# Patient Record
Sex: Male | Born: 1935 | Race: White | Hispanic: No | Marital: Married | Smoking: Former smoker
Health system: Southern US, Community
[De-identification: ages and names within clinical notes are randomized; demographics above are authoritative.]

## PROBLEM LIST (undated history)

## (undated) DIAGNOSIS — E039 Hypothyroidism, unspecified: Secondary | ICD-10-CM

## (undated) DIAGNOSIS — K219 Gastro-esophageal reflux disease without esophagitis: Secondary | ICD-10-CM

## (undated) DIAGNOSIS — R7301 Impaired fasting glucose: Secondary | ICD-10-CM

## (undated) DIAGNOSIS — C080 Malignant neoplasm of submandibular gland: Secondary | ICD-10-CM

## (undated) DIAGNOSIS — I10 Essential (primary) hypertension: Secondary | ICD-10-CM

## (undated) DIAGNOSIS — E079 Disorder of thyroid, unspecified: Secondary | ICD-10-CM

## (undated) DIAGNOSIS — I739 Peripheral vascular disease, unspecified: Secondary | ICD-10-CM

## (undated) DIAGNOSIS — G459 Transient cerebral ischemic attack, unspecified: Secondary | ICD-10-CM

## (undated) DIAGNOSIS — M7022 Olecranon bursitis, left elbow: Secondary | ICD-10-CM

## (undated) DIAGNOSIS — C801 Malignant (primary) neoplasm, unspecified: Secondary | ICD-10-CM

## (undated) DIAGNOSIS — G4700 Insomnia, unspecified: Secondary | ICD-10-CM

## (undated) DIAGNOSIS — R9431 Abnormal electrocardiogram [ECG] [EKG]: Secondary | ICD-10-CM

## (undated) DIAGNOSIS — I2510 Atherosclerotic heart disease of native coronary artery without angina pectoris: Secondary | ICD-10-CM

## (undated) DIAGNOSIS — N521 Erectile dysfunction due to diseases classified elsewhere: Secondary | ICD-10-CM

## (undated) HISTORY — DX: Gastro-esophageal reflux disease without esophagitis: K21.9

## (undated) HISTORY — DX: Transient cerebral ischemic attack, unspecified: G45.9

## (undated) HISTORY — DX: Atherosclerotic heart disease of native coronary artery without angina pectoris: I25.10

## (undated) HISTORY — DX: Olecranon bursitis, left elbow: M70.22

## (undated) HISTORY — DX: Peripheral vascular disease, unspecified: I73.9

## (undated) HISTORY — DX: Essential (primary) hypertension: I10

## (undated) HISTORY — DX: Abnormal electrocardiogram (ECG) (EKG): R94.31

## (undated) HISTORY — DX: Impaired fasting glucose: R73.01

## (undated) HISTORY — DX: Erectile dysfunction due to diseases classified elsewhere: N52.1

## (undated) HISTORY — DX: Insomnia, unspecified: G47.00

## (undated) HISTORY — DX: Malignant neoplasm of submandibular gland: C08.0

## (undated) HISTORY — DX: Hypothyroidism, unspecified: E03.9

---
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
## 2009-08-02 HISTORY — PX: CORONARY ARTERY BYPASS GRAFT: SHX141

## 2014-08-02 HISTORY — PX: SALIVARY GLAND SURGERY: SHX768

## 2017-10-31 HISTORY — PX: ELBOW SURGERY: SHX618

## 2018-12-19 ENCOUNTER — Encounter: Payer: Self-pay | Admitting: Cardiology

## 2019-01-07 NOTE — Progress Notes (Signed)
Virtual Visit via Video Note   This visit type was conducted due to national recommendations for restrictions regarding the COVID-19 Pandemic (e.g. social distancing) in an effort to limit this patient's exposure and mitigate transmission in our community.  Due to his co-morbid illnesses, this patient is at least at moderate risk for complications without adequate follow up.  This format is felt to be most appropriate for this patient at this time.  All issues noted in this document were discussed and addressed.  A limited physical exam was performed with this format.  Please refer to the patient's chart for his consent to telehealth for [facility].   Date:  01/08/2019   ID:  Keith Watson., DOB 03/26/1934, MRN [id number]  Patient Location: Home Provider Location: Office  PCP:  Nicoletta Dress, MD  Cardiologist:  Shirlee More, MD  Electrophysiologist:  None A   Evaluation Performed:  Follow-Up Visit  Chief Complaint:  I had a stroke  History of Present Illness:    Keith Watson. is a 85 y.o. male with CAD CABG and a recent stroke.  I have not seen Faith for over 3 years and there are no records available in epic.  He has a history of CAD bypass surgery in 2011 has had a very fortunate course with no angina dyspnea ACS palpitation or syncope.  He is on lipid-lowering and antihypertensive therapy.  He had a recent stroke resulting in gait dysfunction left lower extremity weakness and wanted to reestablish care with me but also ask what can be done to mitigate the effects allow him to regain his function.  At his request I will refer him to physical therapy.  He takes single antiplatelet agent will add clopidogrel to his current antihypertensives and combined lipid-lowering with this statin and Zetia and also do a 14-day monitor to screen him for atrial fibrillation.  He has had no bleeding palpitation or recurrent neurologic deficit.  He had duplex of the carotid  arteries performed at [facility] 12/21/2018 impression was a large amount of left-sided atherosclerotic plaque with 50 to 69% luminal moderate narrowing of the left internal carotid artery and a moderate amount of right-sided atherosclerotic plaque without stenosis.  He also had an MRI of the brain which showed probable small focus of acute infarction in the right frontal lobe white matter and atrophy and chronic ischemic changes the area in the right frontal lobe had increased signal on diffusion likely representing acute infarction  The patient does not have symptoms concerning for COVID-19 infection (fever, chills, cough, or new shortness of breath).    Past Medical History:  Diagnosis Date  . CAD (coronary artery disease) 01/08/2019  . Cancer ([place]) 01/08/2019  . Hyperlipidemia 01/08/2019  . Hypertension 01/08/2019  . Thyroid disease 01/08/2019   Past Surgical History:  Procedure Laterality Date  . CORONARY ARTERY BYPASS GRAFT  2011     Current Meds  Medication Sig  . aspirin EC 81 MG tablet Take 81 mg by mouth daily.  Marland Kitchen atenolol (TENORMIN) 100 MG tablet Take 100 mg by mouth daily.  Marland Kitchen atorvastatin (LIPITOR) 80 MG tablet Take 80 mg by mouth at bedtime.  Marland Kitchen ezetimibe (ZETIA) 10 MG tablet Take 10 mg by mouth daily.  Marland Kitchen levothyroxine (SYNTHROID) 150 MCG tablet Take 150 mcg by mouth daily before breakfast.  . lisinopril (ZESTRIL) 10 MG tablet Take 10 mg by mouth at bedtime.  Marland Kitchen omeprazole (PRILOSEC) 20 MG capsule TAKE 1 CAPSULE BY  MOUTH ONCE DAILY AS NEEDED  . traZODone (DESYREL) 100 MG tablet Take 100 mg by mouth at bedtime.     Allergies:   Patient has no known allergies.   Social History   Tobacco Use  . Smoking status: Former Smoker    Packs/day: 1.00    Years: 10.00    Pack years: 10.00    Types: Cigarettes  . Smokeless tobacco: Never Used  . Tobacco comment: quit 45 years  Substance Use Topics  . Alcohol use: Yes    Alcohol/week: 1.0 - 2.0 standard drinks    Types: 1 - 2  Standard drinks or equivalent per week    Comment: 1-2 cocktails daily  . Drug use: Never     Family Hx: The patient's family history includes Heart attack in his mother.  ROS:   Please see the history of present illness.    Review of Systems  Constitution: Negative.  HENT: Negative.   Eyes: Negative.   Cardiovascular: Negative.   Respiratory: Negative.   Endocrine: Negative.   Hematologic/Lymphatic: Negative.   Skin: Negative.   Musculoskeletal: Negative.   Gastrointestinal: Negative.   Genitourinary: Negative.   Neurological: Positive for focal weakness (LLE).  Psychiatric/Behavioral: Negative.   Allergic/Immunologic: Negative.    All other systems reviewed and are negative.   Prior CV studies:   The following studies were reviewed today:    Labs/Other Tests and Data Reviewed:    EKG:  No ECG reviewed.  Recent Labs: No results found for requested labs within last 8760 hours.   Recent Lipid Panel No results found for: CHOL, TRIG, HDL, CHOLHDL, LDLCALC, LDLDIRECT  Wt Readings from Last 3 Encounters:  01/08/19 180 lb (81.6 kg)     Objective:    Vital Signs:  BP 130/80   Ht 5' 10.5" (1.791 m)   Wt 180 lb (81.6 kg)   BMI 25.46 kg/m    VITAL SIGNS:  reviewed GEN:  no acute distress EYES:  sclerae anicteric, EOMI - Extraocular Movements Intact RESPIRATORY:  normal respiratory effort, symmetric expansion CARDIOVASCULAR:  no peripheral edema SKIN:  no rash, lesions or ulcers. MUSCULOSKELETAL:  no obvious deformities. NEURO:  alert and oriented x 3, no obvious focal deficit PSYCH:  normal affect  ASSESSMENT & PLAN:    1.   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  His carotid stenosis is contralateral to the stroke.  He will need a follow-up cerebrovascular duplex in 1 year Hypertension stable continue current treatment beta-blocker ACE inhibitor  Hyperlipidemia stable continue statin CAD stable after CABG and with current anti-ischemic medications at this time I would not pursue an ischemia evaluation continue medical treatment and add clopidogrel to his aspirin therapy   COVID-19 Education: The signs and symptoms of COVID-19 were discussed with the patient and how to seek care for testing (follow up with PCP or arrange E-visit).  The importance of social distancing was discussed today.  Time:   Today, I have spent 30 minutes with the patient with telehealth technology discussing the above problems.     Medication Adjustments/Labs and Tests Ordered: Current medicines are reviewed at length with the patient today.  Concerns regarding medicines are outlined above.   Tests Ordered: No orders of the defined types were placed in this encounter.   Medication Changes: No orders of the defined types were placed in this encounter.   Disposition:  Follow up in  6 week(s)  Signed, Shirlee More, MD  01/08/2019 9:05 AM    [place]

## 2019-01-08 ENCOUNTER — Telehealth (INDEPENDENT_AMBULATORY_CARE_PROVIDER_SITE_OTHER): Payer: Medicare Other | Admitting: Cardiology

## 2019-01-08 ENCOUNTER — Encounter: Payer: Self-pay | Admitting: Cardiology

## 2019-01-08 ENCOUNTER — Other Ambulatory Visit: Payer: Self-pay

## 2019-01-08 ENCOUNTER — Encounter: Payer: Self-pay | Admitting: *Deleted

## 2019-01-08 VITALS — BP 130/80 | Ht 70.5 in | Wt 180.0 lb

## 2019-01-08 DIAGNOSIS — R002 Palpitations: Secondary | ICD-10-CM

## 2019-01-08 DIAGNOSIS — I25810 Atherosclerosis of coronary artery bypass graft(s) without angina pectoris: Secondary | ICD-10-CM

## 2019-01-08 DIAGNOSIS — I639 Cerebral infarction, unspecified: Secondary | ICD-10-CM | POA: Insufficient documentation

## 2019-01-08 DIAGNOSIS — I10 Essential (primary) hypertension: Secondary | ICD-10-CM | POA: Diagnosis not present

## 2019-01-08 DIAGNOSIS — E785 Hyperlipidemia, unspecified: Secondary | ICD-10-CM | POA: Insufficient documentation

## 2019-01-08 DIAGNOSIS — I2510 Atherosclerotic heart disease of native coronary artery without angina pectoris: Secondary | ICD-10-CM | POA: Insufficient documentation

## 2019-01-08 DIAGNOSIS — E079 Disorder of thyroid, unspecified: Secondary | ICD-10-CM

## 2019-01-08 DIAGNOSIS — C801 Malignant (primary) neoplasm, unspecified: Secondary | ICD-10-CM

## 2019-01-08 DIAGNOSIS — I69398 Other sequelae of cerebral infarction: Secondary | ICD-10-CM

## 2019-01-08 HISTORY — DX: Atherosclerotic heart disease of native coronary artery without angina pectoris: I25.10

## 2019-01-08 HISTORY — DX: Malignant (primary) neoplasm, unspecified: C80.1

## 2019-01-08 HISTORY — DX: Hyperlipidemia, unspecified: E78.5

## 2019-01-08 HISTORY — DX: Other sequelae of cerebral infarction: I69.398

## 2019-01-08 HISTORY — DX: Disorder of thyroid, unspecified: E07.9

## 2019-01-08 HISTORY — DX: Cerebral infarction, unspecified: I63.9

## 2019-01-08 HISTORY — DX: Essential (primary) hypertension: I10

## 2019-01-08 MED ORDER — CLOPIDOGREL BISULFATE 75 MG PO TABS: 75.0000 mg | ORAL_TABLET | Freq: Every day | ORAL | 3 refills | Status: DC

## 2019-01-08 NOTE — Patient Instructions (Addendum)
Medication Instructions:  Your physician has recommended you make the following change in your medication:  START: PLAVIX 75 mg (1 Tab) daily  If you need a refill on your cardiac medications before your next appointment, please call your pharmacy.   Lab work: None If you have labs (blood work) drawn today and your tests are completely normal, you will receive your results only by: Marland Kitchen MyChart Message (if you have MyChart) OR . A paper copy in the mail If you have any lab test that is abnormal or we need to change your treatment, we will call you to review the results.  Testing/Procedures: Your physician has recommended that you wear a ZIO monitor. ZIO monitors are medical devices that record the heart's electrical activity. Doctors most often use these monitors to diagnose arrhythmias. Arrhythmias are problems with the speed or rhythm of the heartbeat. The monitor is a small, portable device. You can wear one while you do your normal daily activities. This is usually used to diagnose what is causing palpitations/syncope (passing out).  WEAR 14 DAYS  Follow-Up: At [facility], you and your health needs are our priority.  As part of our continuing mission to provide you with exceptional heart care, we have created designated Provider Care Teams.  These Care Teams include your primary Cardiologist (physician) and Advanced Practice Providers (APPs -  Physician Assistants and Nurse Practitioners) who all work together to provide you with the care you need, when you need it. You will need a follow up appointment in 4 weeks.   Any Other Special Instructions Will Be Listed Below (If Applicable).   YOU WILL BE CONTACTED BY [place] TO SET UP APPOINTMENTS

## 2019-01-10 ENCOUNTER — Telehealth: Payer: Self-pay | Admitting: *Deleted

## 2019-01-10 NOTE — Telephone Encounter (Signed)
Please call Laveda Abbe back at [phone] about recommendations for PT. Need verbal approval for plan of care.

## 2019-01-10 NOTE — Telephone Encounter (Signed)
Yes please contact PT

## 2019-01-10 NOTE — Telephone Encounter (Signed)
Keith Watson,  PT  Is requesting MD approval for PT 2 times a week for 4 weeks, once a week for 4 weeks, therapy focusing on strength, balance, gait, safety e-stim prn for left foot drop and Home exercise program.   pls advise, tx

## 2019-01-11 NOTE — Telephone Encounter (Signed)
Phoned Laveda Abbe, PT, informed that Dr. Bettina Gavia approves his PT plan for patient. No further questions or concerns expressed.

## 2019-01-15 ENCOUNTER — Other Ambulatory Visit (INDEPENDENT_AMBULATORY_CARE_PROVIDER_SITE_OTHER): Payer: Medicare Other

## 2019-01-15 DIAGNOSIS — I639 Cerebral infarction, unspecified: Secondary | ICD-10-CM

## 2019-01-15 DIAGNOSIS — R002 Palpitations: Secondary | ICD-10-CM | POA: Diagnosis not present

## 2019-02-20 NOTE — Progress Notes (Signed)
Virtual Visit via Video Note   This visit type was conducted due to national recommendations for restrictions regarding the COVID-19 Pandemic (e.g. social distancing) in an effort to limit this patient's exposure and mitigate transmission in our community.  Due to his co-morbid illnesses, this patient is at least at moderate risk for complications without adequate follow up.  This format is felt to be most appropriate for this patient at this time.  All issues noted in this document were discussed and addressed.  A limited physical exam was performed with this format.  Please refer to the patient's chart for his consent to telehealth for [facility]. Date:  02/21/2019  Patient is at home with physicians in this office 15 minutes was spent with the patient during this video visit  ID:  Keith Watson., DOB Jul 10, 1934, MRN [id number]  PCP:  Nicoletta Dress, MD  Cardiologist:  Shirlee More, MD    Referring MD: Nicoletta Dress, MD    ASSESSMENT:    1. Coronary artery disease involving coronary bypass graft of native heart without angina pectoris   2. Essential hypertension   3. Mixed hyperlipidemia   4. Cerebrovascular accident (CVA), unspecified mechanism ([place])    PLAN:    In order of problems listed above:  1. Stable CAD continue treatment including long-term dual antiplatelet beta-blocker antihypertensive and high intensity statin.  He is New York Heart Association class I at this time I would not advise an ischemia evaluation. 2. Stable hypertension on home blood pressures continue current treatment 3. Continue statin check liver function lipid profile 4. Improved with physical therapy modalities   Next appointment: 3 months office   Medication Adjustments/Labs and Tests Ordered: Current medicines are reviewed at length with the patient today.  Concerns regarding medicines are outlined above.  No orders of the defined types were placed in this encounter.   No orders of the defined types were placed in this encounter.   No chief complaint on file.   History of Present Illness:    Keith Watson. is a 85 y.o. male with a hx of CAD CABG and a recent stroke  last seen 01/08/2019.He has a history of CAD bypass surgery in 2011 has had a very fortunate course with no angina dyspnea ACS palpitation or syncope. He is on lipid-lowering and antihypertensive therapy. He had a recent stroke resulting in gait dysfunction left lower extremity weakness. He had duplex of the carotid arteries performed at [facility] 12/21/2018 impression was a large amount of left-sided atherosclerotic plaque with 50 to 69% luminal moderate narrowing of the left internal carotid artery and a moderate amount of right-sided atherosclerotic plaque without stenosis.  He also had an MRI of the brain which showed probable small focus of acute infarction in the right frontal lobe white matter and atrophy and chronic ischemic changes the area in the right frontal lobe had increased signal on diffusion likely representing acute infarction    Compliance with diet, lifestyle and medications: Yes  Raja is doing better with his gait balance with physical therapy and speech is improved.  Home blood pressure checked by aides and PTA runs in the range of 130/70 checks several times per week.  No edema shortness of breath chest pain lightheadedness or syncope. Past Medical History:  Diagnosis Date  . Adult hypothyroidism   . CAD (coronary artery disease) 01/08/2019  . 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  Chronic GERD   . Controlled insomnia   . Elevated fasting blood sugar   . Erectile disorder due to medical condition in male   . Hyperlipidemia 01/08/2019  . Hyperlipidemia   . Hypertension 01/08/2019  . Multi-vessel coronary artery stenosis   . Olecranon bursitis of left elbow   . PVD (peripheral vascular disease) ([place])   . Thyroid disease 01/08/2019     Past Surgical History:  Procedure Laterality Date  . CORONARY ARTERY BYPASS GRAFT  2011    Current Medications: Current Meds  Medication Sig  . aspirin EC 81 MG tablet Take 81 mg by mouth daily.  Marland Kitchen atenolol (TENORMIN) 100 MG tablet Take 100 mg by mouth daily.  Marland Kitchen atorvastatin (LIPITOR) 80 MG tablet Take 80 mg by mouth at bedtime.  . clopidogrel (PLAVIX) 75 MG tablet Take 1 tablet (75 mg total) by mouth daily.  Marland Kitchen ezetimibe (ZETIA) 10 MG tablet Take 10 mg by mouth daily.  Marland Kitchen levothyroxine (SYNTHROID) 150 MCG tablet Take 150 mcg by mouth daily before breakfast.  . lisinopril (ZESTRIL) 10 MG tablet Take 10 mg by mouth at bedtime.  Marland Kitchen omeprazole (PRILOSEC) 20 MG capsule TAKE 1 CAPSULE BY MOUTH ONCE DAILY AS NEEDED  . traZODone (DESYREL) 100 MG tablet Take 100 mg by mouth at bedtime.     Allergies:   Patient has no known allergies.   Social History   Socioeconomic History  . Marital status: Married    Spouse name: Not on file  . Number of children: Not on file  . Years of education: Not on file  . Highest education level: Not on file  Occupational History  . 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 Financial resource strain: Not on file  . Food insecurity    Worry: Not on file    Inability: Not on file  . Transportation needs    Medical: Not on file    Non-medical: Not on file  Tobacco Use  . Smoking status: Former Smoker    Packs/day: 1.00    Years: 10.00    Pack years: 10.00    Types: Cigarettes  . Smokeless tobacco: Never Used  . Tobacco comment: quit 45 years  Substance and Sexual Activity  . Alcohol use: Yes    Alcohol/week: 1.0 - 2.0 standard drinks    Types: 1 - 2 Standard drinks or equivalent per week    Comment: 1-2 cocktails daily  . Drug use: Never  . Sexual activity: Not on file  Lifestyle  . Physical activity    Days per week: Not on file    Minutes per session: Not on file  . Stress: Not on file  Relationships  . Social  Herbalist on phone: Not on file    Gets together: Not on file    Attends religious service: Not on file    Active member of club or organization: Not on file    Attends meetings of clubs or organizations: Not on file    Relationship status: Not on file  Other Topics Concern  . Not on file  Social History Narrative  . Not on file     Family History: The patient's family history includes Heart attack in his father and mother; Heart disease in his father; Hypertension in his mother. ROS:   Please see the history of present illness.    All other systems reviewed and are negative.  EKGs/Labs/Other Studies Reviewed:  The following studies were reviewed today:  Zio monitor:   Study Highlights A ZIO monitor was performed for 13 days 23 hours to assess atrial fibrillation associated with stroke beginning 01/15/2019. The rhythm throughout is sinus with first-degree AV block with minimum average and maximum heart rates of 47, 68 and 105 bpm. There were no pauses of 3 seconds or greater and no episodes of sinus node or AV nodal block. Ventricular ectopy was rare with isolated PVCs and couplets.  There were 2 runs of PVCs the longest 12 complexes at a rate of 176 bpm and the fastest 5 complexes at a rate of 185 bpm initiated with a fusion beat. Supraventricular ectopy was rare with predominantly isolated APCs.  There were 20 brief runs of atrial premature contractions the longest 19 complexes at a rate of 126 bpm.  There were no episodes of atrial fibrillation or flutter. There was one triggered event associated with atrial premature contractions. Conclusion absence of atrial fibrillation in a patient with stroke     Recent Labs: No results found for requested labs within last 8760 hours.  Recent Lipid Panel No results found for: CHOL, TRIG, HDL, CHOLHDL, VLDL, LDLCALC, LDLDIRECT  Physical Exam:    VS:  BP (!) 150/74 (BP Location: Left Arm)   Pulse 60   Temp 98 F (36.7 C)    Resp 18   Wt 180 lb 8 oz (81.9 kg)   BMI 25.53 kg/m     Wt Readings from Last 3 Encounters:  02/21/19 180 lb 8 oz (81.9 kg)  01/08/19 180 lb (81.6 kg)     Constitutional, well-nourished well-developed in no acute distress Vital signs reviewed Eyes, conjunctiva and sclera are normal without pallor or icterus extraocular motions intact and normal there is no lid lag Respiratory, normal effort and excursion no audible wheezing without a stethoscope Cardiovascular, no neck vein distention or peripheral edema Skin, no rash skin lesion or ulceration of the extremities Neurologic, cranial nerves II to XII are grossly intact and the patient moves all 4 extremities Neuro/Psychiatric, judgment and thought processes are intact and coherent, alert and oriented x3, mood and affect appear normal.   Signed, Shirlee More, MD  02/21/2019 11:36 AM    [place]

## 2019-02-21 ENCOUNTER — Telehealth (INDEPENDENT_AMBULATORY_CARE_PROVIDER_SITE_OTHER): Payer: Medicare Other | Admitting: Cardiology

## 2019-02-21 ENCOUNTER — Other Ambulatory Visit: Payer: Self-pay

## 2019-02-21 ENCOUNTER — Encounter: Payer: Self-pay | Admitting: Cardiology

## 2019-02-21 VITALS — BP 150/74 | HR 60 | Temp 98.0°F | Resp 18 | Wt 180.5 lb

## 2019-02-21 DIAGNOSIS — I25810 Atherosclerosis of coronary artery bypass graft(s) without angina pectoris: Secondary | ICD-10-CM

## 2019-02-21 DIAGNOSIS — I10 Essential (primary) hypertension: Secondary | ICD-10-CM

## 2019-02-21 DIAGNOSIS — E782 Mixed hyperlipidemia: Secondary | ICD-10-CM

## 2019-02-21 DIAGNOSIS — I639 Cerebral infarction, unspecified: Secondary | ICD-10-CM | POA: Diagnosis not present

## 2019-02-21 NOTE — Patient Instructions (Signed)
Medication Instructions:  Your physician recommends that you continue on your current medications as directed. Please refer to the Current Medication list given to you today.  If you need a refill on your cardiac medications before your next appointment, please call your pharmacy.   Lab work: Your physician recommends that you return for lab work in: [place] CMP,LIPID(Fasting)  If you have labs (blood work) drawn today and your tests are completely normal, you will receive your results only by: Marland Kitchen MyChart Message (if you have MyChart) OR . A paper copy in the mail If you have any lab test that is abnormal or we need to change your treatment, we will call you to review the results.  Testing/Procedures: NOne  Follow-Up: At [facility], you and your health needs are our priority.  As part of our continuing mission to provide you with exceptional heart care, we have created designated Provider Care Teams.  These Care Teams include your primary Cardiologist (physician) and Advanced Practice Providers (APPs -  Physician Assistants and Nurse Practitioners) who all work together to provide you with the care you need, when you need it. You will need a follow up appointment in 3 months.   Any Other Special Instructions Will Be Listed Below (If Applicable).

## 2019-02-27 ENCOUNTER — Telehealth: Payer: Self-pay

## 2019-02-27 LAB — COMPREHENSIVE METABOLIC PANEL
ALT: 18 IU/L (ref 0–44)
AST: 18 IU/L (ref 0–40)
Albumin/Globulin Ratio: 2 (ref 1.2–2.2)
Albumin: 4.4 g/dL (ref 3.6–4.6)
Alkaline Phosphatase: 98 IU/L (ref 39–117)
BUN/Creatinine Ratio: 8 — ABNORMAL LOW (ref 10–24)
BUN: 9 mg/dL (ref 8–27)
Bilirubin Total: 0.9 mg/dL (ref 0.0–1.2)
CO2: 24 mmol/L (ref 20–29)
Calcium: 9.3 mg/dL (ref 8.6–10.2)
Chloride: 101 mmol/L (ref 96–106)
Creatinine, Ser: 1.19 mg/dL (ref 0.76–1.27)
GFR calc Af Amer: 65 mL/min/{1.73_m2} (ref >59)
GFR calc non Af Amer: 57 mL/min/{1.73_m2} — ABNORMAL LOW (ref >59)
Globulin, Total: 2.2 g/dL (ref 1.5–4.5)
Glucose: 95 mg/dL (ref 65–99)
Potassium: 4 mmol/L (ref 3.5–5.2)
Sodium: 142 mmol/L (ref 134–144)
Total Protein: 6.6 g/dL (ref 6.0–8.5)

## 2019-02-27 LAB — LIPID PANEL
Chol/HDL Ratio: 2.3 ratio (ref 0.0–5.0)
Cholesterol, Total: 148 mg/dL (ref 100–199)
HDL: 64 mg/dL (ref >39)
LDL Calculated: 63 mg/dL (ref 0–99)
Triglycerides: 105 mg/dL (ref 0–149)
VLDL Cholesterol Cal: 21 mg/dL (ref 5–40)

## 2019-02-27 NOTE — Telephone Encounter (Signed)
lmtcb for lab results

## 2019-02-27 NOTE — Telephone Encounter (Signed)
Information relayed to patient and he requested a copy be mailed to him. No further questions at this time.

## 2019-02-27 NOTE — Telephone Encounter (Signed)
-----   Message from Richardo Priest, MD sent at 02/27/2019  8:18 AM EDT ----- Good result no changes

## 2019-05-23 ENCOUNTER — Ambulatory Visit: Payer: Medicare Other | Admitting: Cardiology

## 2019-06-01 ENCOUNTER — Ambulatory Visit (INDEPENDENT_AMBULATORY_CARE_PROVIDER_SITE_OTHER): Payer: Medicare Other | Admitting: Cardiology

## 2019-06-01 ENCOUNTER — Other Ambulatory Visit: Payer: Self-pay

## 2019-06-01 ENCOUNTER — Encounter: Payer: Self-pay | Admitting: Cardiology

## 2019-06-01 VITALS — BP 122/60 | HR 68 | Resp 14 | Wt 180.6 lb

## 2019-06-01 DIAGNOSIS — E782 Mixed hyperlipidemia: Secondary | ICD-10-CM

## 2019-06-01 DIAGNOSIS — I25810 Atherosclerosis of coronary artery bypass graft(s) without angina pectoris: Secondary | ICD-10-CM

## 2019-06-01 DIAGNOSIS — I10 Essential (primary) hypertension: Secondary | ICD-10-CM

## 2019-06-01 DIAGNOSIS — I639 Cerebral infarction, unspecified: Secondary | ICD-10-CM | POA: Diagnosis not present

## 2019-06-01 MED ORDER — NITROGLYCERIN 0.4 MG SL SUBL: 0.4000 mg | SUBLINGUAL_TABLET | SUBLINGUAL | 5 refills | Status: DC | PRN

## 2019-06-01 NOTE — Patient Instructions (Signed)
Medication Instructions:  Your physician has recommended you make the following change in your medication:   START nitroglycerin as needed for chest pain: When having chest pain, stop what you are doing and sit down. Take 1 nitro, wait 5 minutes. Still having chest pain, take 1 nitro, wait 5 minutes. Still having chest pain, take 1 nitro, dial 911. Total of 3 nitro in 15 minutes.  *If you need a refill on your cardiac medications before your next appointment, please call your pharmacy*  Lab Work: Your physician recommends that you return for lab work today: CMP, lipid panel.   If you have labs (blood work) drawn today and your tests are completely normal, you will receive your results only by: Marland Kitchen MyChart Message (if you have MyChart) OR . A paper copy in the mail If you have any lab test that is abnormal or we need to change your treatment, we will call you to review the results.  Testing/Procedures: You had an EKG today.   Follow-Up: At [facility], you and your health needs are our priority.  As part of our continuing mission to provide you with exceptional heart care, we have created designated Provider Care Teams.  These Care Teams include your primary Cardiologist (physician) and Advanced Practice Providers (APPs -  Physician Assistants and Nurse Practitioners) who all work together to provide you with the care you need, when you need it.  Your next appointment:   6 months  The format for your next appointment:   In Person  Provider:   Shirlee More, MD

## 2019-06-01 NOTE — Progress Notes (Signed)
Cardiology Office Note:    Date:  06/01/2019   ID:  Keith Client., DOB 09/03/1933, MRN [id number]  PCP:  Nicoletta Dress, MD  Cardiologist:  Shirlee More, MD    Referring MD: Nicoletta Dress, MD    ASSESSMENT:    1. Coronary artery disease involving coronary bypass graft of native heart without angina pectoris   2. Essential hypertension   3. Mixed hyperlipidemia   4. Cerebrovascular accident (CVA), unspecified mechanism ([place])    PLAN:    In order of problems listed above:  1. Stable CAD after surgical revascularization having no angina on current medical treatment New York Heart Association functional class I.  He will continue treatment including aspirin beta-blocker and intensive lipid-lowering treatment with statin high intensity and Zetia.  We discussed ischemia evaluation both of his fields not required at this time 2. Stable hypertension BP at target and continue ACE inhibitor 3. Stable dyslipidemia combined high intensity statin and Zetia, check labs including CMP lipid profile for safety and efficacy 4. He has had a very significant functional improvement and near complete recovery from stroke he will continue intensive treatment including antiplatelet antihypertensives and lipid-lowering therapy.   Next appointment: 6 months   Medication Adjustments/Labs and Tests Ordered: Current medicines are reviewed at length with the patient today.  Concerns regarding medicines are outlined above.  No orders of the defined types were placed in this encounter.  No orders of the defined types were placed in this encounter.   Chief Complaint  Patient presents with  . Follow-up  . Coronary Artery Disease  . Hypertension  . Hyperlipidemia    History of Present Illness:   CAD CABG and a recent stroke  in May 2020.He has a history of CAD bypass surgery in 2011 has had a very fortunate course with no angina dyspnea ACS palpitation or syncope. He is on  lipid-lowering and antihypertensive therapy. He had a recent stroke resulting in gait dysfunction left lower extremity weakness. He had duplex of the carotid arteries performed at [facility] 12/21/2018 impression was a large amount of left-sided atherosclerotic plaque with 50 to 69% luminal moderate narrowing of the left internal carotid artery and a moderate amount of right-sided atherosclerotic plaque without stenosis.  He also had an MRI of the brain which showed probable small focus of acute infarction in the right frontal lobe white matter and atrophy and chronic ischemic changes the area in the right frontal lobe had increased signal on diffusion likely representing acute infarction    He was last seen 02/21/2019 and a virtual video visit.  Compliance with diet, lifestyle and medications: Yes  This is his first in office visit he has had 2 video virtual visits.  He has markedly improved from his stroke his gait is stable his speech is fluent and just mildly dysarthric.  He is returned to full activities including doing heavy garden work as no exercise intolerance weakness shortness of breath edema chest pain palpitation or syncope. Past Medical History:  Diagnosis Date  . Adult hypothyroidism   . CAD (coronary artery disease) 01/08/2019  . 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 Chronic GERD   . Controlled insomnia   . Elevated fasting blood sugar   . Erectile disorder due to medical condition in male   . Hyperlipidemia 01/08/2019  . Hyperlipidemia   . Hypertension 01/08/2019  . Multi-vessel coronary artery stenosis   . Olecranon bursitis of left elbow   .  PVD (peripheral vascular disease) ([place])   . Thyroid disease 01/08/2019    Past Surgical History:  Procedure Laterality Date  . CORONARY ARTERY BYPASS GRAFT  2011    Current Medications: No outpatient medications have been marked as taking for the 06/01/19 encounter (Office Visit) with Richardo Priest, MD.      Allergies:   Patient has no known allergies.   Social History   Socioeconomic History  . Marital status: Married    Spouse name: Not on file  . Number of children: Not on file  . Years of education: Not on file  . Highest education level: Not on file  Occupational History  . 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 Financial resource strain: Not on file  . Food insecurity    Worry: Not on file    Inability: Not on file  . Transportation needs    Medical: Not on file    Non-medical: Not on file  Tobacco Use  . Smoking status: Former Smoker    Packs/day: 1.00    Years: 10.00    Pack years: 10.00    Types: Cigarettes  . Smokeless tobacco: Never Used  . Tobacco comment: quit 45 years  Substance and Sexual Activity  . Alcohol use: Yes    Alcohol/week: 1.0 - 2.0 standard drinks    Types: 1 - 2 Standard drinks or equivalent per week    Comment: 1-2 cocktails daily  . Drug use: Never  . Sexual activity: Not on file  Lifestyle  . Physical activity    Days per week: Not on file    Minutes per session: Not on file  . Stress: Not on file  Relationships  . Social Herbalist on phone: Not on file    Gets together: Not on file    Attends religious service: Not on file    Active member of club or organization: Not on file    Attends meetings of clubs or organizations: Not on file    Relationship status: Not on file  Other Topics Concern  . Not on file  Social History Narrative  . Not on file     Family History: The patient's family history includes Heart attack in his father and mother; Heart disease in his father; Hypertension in his mother. ROS:   Please see the history of present illness.    All other systems reviewed and are negative.  EKGs/Labs/Other Studies Reviewed:    The following studies were reviewed today:  EKG:  EKG ordered today and personally reviewed.  The ekg ordered today demonstrates sinus rhythm  first-degree AV block single PVC old inferior apical MI incomplete right bundle branch block  Recent Labs: 02/26/2019: ALT 18; BUN 9; Creatinine, Ser 1.19; Potassium 4.0; Sodium 142  Recent Lipid Panel    Component Value Date/Time   CHOL 148 02/26/2019 1029   TRIG 105 02/26/2019 1029   HDL 64 02/26/2019 1029   CHOLHDL 2.3 02/26/2019 1029   LDLCALC 63 02/26/2019 1029    Physical Exam:    VS:  BP 122/60 (BP Location: Right Arm, Patient Position: Sitting, Cuff Size: Normal)   Pulse 68   Resp 14   Wt 180 lb 9.6 oz (81.9 kg)   BMI 25.55 kg/m     Wt Readings from Last 3 Encounters:  06/01/19 180 lb 9.6 oz (81.9 kg)  02/21/19 180 lb 8 oz (81.9 kg)  01/08/19 180 lb (81.6  kg)     GEN:  Well nourished, well developed in no acute distress HEENT: Normal NECK: No JVD; No carotid bruits LYMPHATICS: No lymphadenopathy CARDIAC:  RRR, no murmurs, rubs, gallops RESPIRATORY:  Clear to auscultation without rales, wheezing or rhonchi  ABDOMEN: Soft, non-tender, non-distended MUSCULOSKELETAL:  No edema; No deformity  SKIN: Warm and dry NEUROLOGIC:  Alert and oriented x 3 PSYCHIATRIC:  Normal affect    Signed, Shirlee More, MD  06/01/2019 11:07 AM    [place]

## 2019-06-02 LAB — COMPREHENSIVE METABOLIC PANEL
ALT: 18 IU/L (ref 0–44)
AST: 19 IU/L (ref 0–40)
Albumin/Globulin Ratio: 2.1 (ref 1.2–2.2)
Albumin: 4.5 g/dL (ref 3.6–4.6)
Alkaline Phosphatase: 102 IU/L (ref 39–117)
BUN/Creatinine Ratio: 9 — ABNORMAL LOW (ref 10–24)
BUN: 11 mg/dL (ref 8–27)
Bilirubin Total: 0.9 mg/dL (ref 0.0–1.2)
CO2: 24 mmol/L (ref 20–29)
Calcium: 9.2 mg/dL (ref 8.6–10.2)
Chloride: 105 mmol/L (ref 96–106)
Creatinine, Ser: 1.22 mg/dL (ref 0.76–1.27)
GFR calc Af Amer: 63 mL/min/{1.73_m2} (ref >59)
GFR calc non Af Amer: 54 mL/min/{1.73_m2} — ABNORMAL LOW (ref >59)
Globulin, Total: 2.1 g/dL (ref 1.5–4.5)
Glucose: 92 mg/dL (ref 65–99)
Potassium: 3.7 mmol/L (ref 3.5–5.2)
Sodium: 143 mmol/L (ref 134–144)
Total Protein: 6.6 g/dL (ref 6.0–8.5)

## 2019-06-02 LAB — LIPID PANEL
Chol/HDL Ratio: 2.2 ratio (ref 0.0–5.0)
Cholesterol, Total: 147 mg/dL (ref 100–199)
HDL: 66 mg/dL (ref >39)
LDL Chol Calc (NIH): 64 mg/dL (ref 0–99)
Triglycerides: 94 mg/dL (ref 0–149)
VLDL Cholesterol Cal: 17 mg/dL (ref 5–40)

## 2019-08-23 DIAGNOSIS — L570 Actinic keratosis: Secondary | ICD-10-CM | POA: Diagnosis not present

## 2019-08-23 DIAGNOSIS — L820 Inflamed seborrheic keratosis: Secondary | ICD-10-CM | POA: Diagnosis not present

## 2019-08-23 DIAGNOSIS — C44319 Basal cell carcinoma of skin of other parts of face: Secondary | ICD-10-CM | POA: Diagnosis not present

## 2019-08-23 DIAGNOSIS — L578 Other skin changes due to chronic exposure to nonionizing radiation: Secondary | ICD-10-CM | POA: Diagnosis not present

## 2019-09-23 ENCOUNTER — Other Ambulatory Visit: Payer: Self-pay | Admitting: Cardiology

## 2019-09-26 DIAGNOSIS — E785 Hyperlipidemia, unspecified: Secondary | ICD-10-CM | POA: Diagnosis not present

## 2019-09-26 DIAGNOSIS — I2510 Atherosclerotic heart disease of native coronary artery without angina pectoris: Secondary | ICD-10-CM | POA: Diagnosis not present

## 2019-09-26 DIAGNOSIS — E039 Hypothyroidism, unspecified: Secondary | ICD-10-CM | POA: Diagnosis not present

## 2019-09-26 DIAGNOSIS — H6123 Impacted cerumen, bilateral: Secondary | ICD-10-CM | POA: Diagnosis not present

## 2019-09-26 DIAGNOSIS — R7301 Impaired fasting glucose: Secondary | ICD-10-CM | POA: Diagnosis not present

## 2019-09-27 DIAGNOSIS — Z79899 Other long term (current) drug therapy: Secondary | ICD-10-CM | POA: Diagnosis not present

## 2019-09-27 DIAGNOSIS — E785 Hyperlipidemia, unspecified: Secondary | ICD-10-CM | POA: Diagnosis not present

## 2019-09-27 DIAGNOSIS — E039 Hypothyroidism, unspecified: Secondary | ICD-10-CM | POA: Diagnosis not present

## 2019-09-27 DIAGNOSIS — R7301 Impaired fasting glucose: Secondary | ICD-10-CM | POA: Diagnosis not present

## 2019-09-27 DIAGNOSIS — Z125 Encounter for screening for malignant neoplasm of prostate: Secondary | ICD-10-CM | POA: Diagnosis not present

## 2019-11-13 DIAGNOSIS — I10 Essential (primary) hypertension: Secondary | ICD-10-CM | POA: Diagnosis not present

## 2020-03-03 DIAGNOSIS — Z1331 Encounter for screening for depression: Secondary | ICD-10-CM | POA: Diagnosis not present

## 2020-03-03 DIAGNOSIS — Z139 Encounter for screening, unspecified: Secondary | ICD-10-CM | POA: Diagnosis not present

## 2020-03-03 DIAGNOSIS — E785 Hyperlipidemia, unspecified: Secondary | ICD-10-CM | POA: Diagnosis not present

## 2020-03-03 DIAGNOSIS — Z9181 History of falling: Secondary | ICD-10-CM | POA: Diagnosis not present

## 2020-03-03 DIAGNOSIS — Z0000 Encounter for general adult medical examination without abnormal findings: Secondary | ICD-10-CM | POA: Diagnosis not present

## 2020-04-29 DIAGNOSIS — R7301 Impaired fasting glucose: Secondary | ICD-10-CM | POA: Diagnosis not present

## 2020-04-29 DIAGNOSIS — Z23 Encounter for immunization: Secondary | ICD-10-CM | POA: Diagnosis not present

## 2020-04-29 DIAGNOSIS — I10 Essential (primary) hypertension: Secondary | ICD-10-CM | POA: Diagnosis not present

## 2020-04-29 DIAGNOSIS — E039 Hypothyroidism, unspecified: Secondary | ICD-10-CM | POA: Diagnosis not present

## 2020-04-29 DIAGNOSIS — I2510 Atherosclerotic heart disease of native coronary artery without angina pectoris: Secondary | ICD-10-CM | POA: Diagnosis not present

## 2020-04-29 DIAGNOSIS — E785 Hyperlipidemia, unspecified: Secondary | ICD-10-CM | POA: Diagnosis not present

## 2020-07-01 DIAGNOSIS — J069 Acute upper respiratory infection, unspecified: Secondary | ICD-10-CM | POA: Diagnosis not present

## 2020-07-01 DIAGNOSIS — B9689 Other specified bacterial agents as the cause of diseases classified elsewhere: Secondary | ICD-10-CM | POA: Diagnosis not present

## 2020-07-01 DIAGNOSIS — J019 Acute sinusitis, unspecified: Secondary | ICD-10-CM | POA: Diagnosis not present

## 2020-07-01 DIAGNOSIS — J208 Acute bronchitis due to other specified organisms: Secondary | ICD-10-CM | POA: Diagnosis not present

## 2020-10-22 DIAGNOSIS — N521 Erectile dysfunction due to diseases classified elsewhere: Secondary | ICD-10-CM | POA: Insufficient documentation

## 2020-10-22 DIAGNOSIS — I2510 Atherosclerotic heart disease of native coronary artery without angina pectoris: Secondary | ICD-10-CM | POA: Insufficient documentation

## 2020-10-22 DIAGNOSIS — G4700 Insomnia, unspecified: Secondary | ICD-10-CM | POA: Insufficient documentation

## 2020-10-22 DIAGNOSIS — E039 Hypothyroidism, unspecified: Secondary | ICD-10-CM | POA: Insufficient documentation

## 2020-10-22 DIAGNOSIS — K219 Gastro-esophageal reflux disease without esophagitis: Secondary | ICD-10-CM | POA: Insufficient documentation

## 2020-10-22 DIAGNOSIS — I739 Peripheral vascular disease, unspecified: Secondary | ICD-10-CM | POA: Insufficient documentation

## 2020-10-22 DIAGNOSIS — R7301 Impaired fasting glucose: Secondary | ICD-10-CM | POA: Insufficient documentation

## 2020-10-22 DIAGNOSIS — M7022 Olecranon bursitis, left elbow: Secondary | ICD-10-CM | POA: Insufficient documentation

## 2020-10-31 DIAGNOSIS — R112 Nausea with vomiting, unspecified: Secondary | ICD-10-CM | POA: Diagnosis not present

## 2020-10-31 DIAGNOSIS — R42 Dizziness and giddiness: Secondary | ICD-10-CM | POA: Diagnosis not present

## 2020-11-05 NOTE — Progress Notes (Deleted)
Cardiology Office Note:    Date:  11/05/2020   ID:  Keith Watson., DOB 05-03-1934, MRN [id number]  PCP:  Keith Dress, MD  Cardiologist:  Keith More, MD    Referring MD: Keith Dress, MD    ASSESSMENT:    No diagnosis found. PLAN:    In order of problems listed above:  1. ***   Next appointment: ***   Medication Adjustments/Labs and Tests Ordered: Current medicines are reviewed at length with the patient today.  Concerns regarding medicines are outlined above.  No orders of the defined types were placed in this encounter.  No orders of the defined types were placed in this encounter.   No chief complaint on file.   History of Present Illness:    Keith Watson. is a 87 y.o. male with a hx of CAD with CABG in 2011 stroke 2020 left ICA plaque and stenosis 50 to 69% hypertension and hyperlipidemia last seen 06/01/2019. Compliance with diet, lifestyle and medications: *** Past Medical History:  Diagnosis Date  . Adult hypothyroidism   . CAD (coronary artery disease) 01/08/2019  . 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 Chronic GERD   . Controlled insomnia   . Elevated fasting blood sugar   . Erectile disorder due to medical condition in male   . Hyperlipidemia 01/08/2019  . Hyperlipidemia   . Hypertension 01/08/2019  . Multi-vessel coronary artery stenosis   . Olecranon bursitis of left elbow   . PVD (peripheral vascular disease) ([place])   . Thyroid disease 01/08/2019    Past Surgical History:  Procedure Laterality Date  . CORONARY ARTERY BYPASS GRAFT  2011    Current Medications: No outpatient medications have been marked as taking for the 11/06/20 encounter (Appointment) with Richardo Priest, MD.     Allergies:   Patient has no known allergies.   Social History   Socioeconomic History  . Marital status: Married    Spouse name: Not on file  . Number of children: Not on file  . Years of education: Not on  file  . Highest education level: Not on file  Occupational History  . Occupation: Physiological scientist    Comment: Retired  Tobacco Use  . Smoking status: Former Smoker    Packs/day: 1.00    Years: 10.00    Pack years: 10.00    Types: Cigarettes  . Smokeless tobacco: Never Used  . Tobacco comment: quit 45 years  Vaping Use  . Vaping Use: Never used  Substance and Sexual Activity  . Alcohol use: Yes    Alcohol/week: 1.0 - 2.0 standard drink    Types: 1 - 2 Standard drinks or equivalent per week    Comment: 1-2 cocktails daily  . Drug use: Never  . Sexual activity: Not on file  Other Topics Concern  . Not on file  Social History Narrative  . Not on file   Social Determinants of Health   Financial Resource Strain: Not on file  Food Insecurity: Not on file  Transportation Needs: Not on file  Physical Activity: Not on file  Stress: Not on file  Social Connections: Not on file     Family History: The patient's ***family history includes Heart attack in his father and mother; Heart disease in his father; Hypertension in his mother. ROS:   Please see the history of present illness.    All other systems reviewed and are negative.  EKGs/Labs/Other  Studies Reviewed:    The following studies were reviewed today:  EKG:  EKG ordered today and personally reviewed.  The ekg ordered today demonstrates ***  Recent Labs: No results found for requested labs within last 8760 hours.  Recent Lipid Panel    Component Value Date/Time   CHOL 147 06/01/2019 1142   TRIG 94 06/01/2019 1142   HDL 66 06/01/2019 1142   CHOLHDL 2.2 06/01/2019 1142   LDLCALC 64 06/01/2019 1142    Physical Exam:    VS:  There were no vitals taken for this visit.    Wt Readings from Last 3 Encounters:  06/01/19 180 lb 9.6 oz (81.9 kg)  02/21/19 180 lb 8 oz (81.9 kg)  01/08/19 180 lb (81.6 kg)     GEN: *** Well nourished, well developed in no acute distress HEENT: Normal NECK: No JVD; No carotid  bruits LYMPHATICS: No lymphadenopathy CARDIAC: ***RRR, no murmurs, rubs, gallops RESPIRATORY:  Clear to auscultation without rales, wheezing or rhonchi  ABDOMEN: Soft, non-tender, non-distended MUSCULOSKELETAL:  No edema; No deformity  SKIN: Warm and dry NEUROLOGIC:  Alert and oriented x 3 PSYCHIATRIC:  Normal affect    Signed, Keith More, MD  11/05/2020 3:34 PM    [facility]

## 2020-11-06 ENCOUNTER — Ambulatory Visit: Payer: Medicare Other | Admitting: Cardiology

## 2020-11-27 DIAGNOSIS — M79605 Pain in left leg: Secondary | ICD-10-CM | POA: Diagnosis not present

## 2020-11-27 DIAGNOSIS — R600 Localized edema: Secondary | ICD-10-CM | POA: Diagnosis not present

## 2020-11-27 DIAGNOSIS — I82812 Embolism and thrombosis of superficial veins of left lower extremities: Secondary | ICD-10-CM | POA: Diagnosis not present

## 2020-12-11 DIAGNOSIS — E785 Hyperlipidemia, unspecified: Secondary | ICD-10-CM | POA: Diagnosis not present

## 2020-12-11 DIAGNOSIS — R7301 Impaired fasting glucose: Secondary | ICD-10-CM | POA: Diagnosis not present

## 2020-12-11 DIAGNOSIS — E039 Hypothyroidism, unspecified: Secondary | ICD-10-CM | POA: Diagnosis not present

## 2020-12-11 DIAGNOSIS — I10 Essential (primary) hypertension: Secondary | ICD-10-CM | POA: Diagnosis not present

## 2020-12-11 DIAGNOSIS — C080 Malignant neoplasm of submandibular gland: Secondary | ICD-10-CM | POA: Diagnosis not present

## 2020-12-11 DIAGNOSIS — E538 Deficiency of other specified B group vitamins: Secondary | ICD-10-CM | POA: Diagnosis not present

## 2020-12-11 DIAGNOSIS — I2510 Atherosclerotic heart disease of native coronary artery without angina pectoris: Secondary | ICD-10-CM | POA: Diagnosis not present

## 2020-12-11 DIAGNOSIS — R5382 Chronic fatigue, unspecified: Secondary | ICD-10-CM | POA: Diagnosis not present

## 2020-12-15 ENCOUNTER — Encounter: Payer: Self-pay | Admitting: *Deleted

## 2020-12-15 ENCOUNTER — Encounter: Payer: Self-pay | Admitting: Cardiology

## 2020-12-15 DIAGNOSIS — E538 Deficiency of other specified B group vitamins: Secondary | ICD-10-CM | POA: Diagnosis not present

## 2020-12-16 ENCOUNTER — Telehealth: Payer: Self-pay | Admitting: Oncology

## 2020-12-16 NOTE — Telephone Encounter (Signed)
Patient re-referred by Dr Nelda Bucks for Hx: Submandibular CA.  Appt made for 12/18/20 Consult at 11:15 pm

## 2020-12-17 NOTE — Progress Notes (Signed)
Keith Watson  [address] [place],  [place]  [address] [phone]  Clinic Day:  12/18/2020  Referring physician: Nicoletta Dress, MD   HISTORY OF PRESENT ILLNESS:  The patient is a 87 y.o. male  who I was asked to consult upon for weight loss.  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  The patient believes he has lost 30-35 pounds since the beginning of the year.  Thyroid testing showed no evidence of hyperthyroidism.  Although his weight loss has been unintentional, it does not particularly bother him.  He did undergo a colonoscopy 10-12 years ago, which was normal.  He does mention being more fatigued, but he recently had a hemoglobin that was normal at 17.1.  His MCV was mildly elevated at 98.  As he had an undetectable folate level of <2.0, the patient was prescribed folic acid, but he has yet to go to the pharmacy to pick it up.  As his vitamin B12 level was borderline low at 244, he just started receiving weekly B12 injections.    PAST MEDICAL HISTORY:   Past Medical History:  Diagnosis Date  . Adult hypothyroidism   . Benign essential hypertension   . CAD (coronary artery disease) 01/08/2019  . 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[place])   . Chronic GERD   . Controlled insomnia   . Elevated fasting blood sugar   . Erectile disorder due to medical condition in male   . Hyperlipidemia 01/08/2019  . Hypertension 01/08/2019  . Multi-vessel coronary artery stenosis   . Olecranon bursitis of left elbow   . PVD (peripheral vascular disease) ([place])   . Thyroid disease 01/08/2019  . TIA (transient ischemic attack)     PAST SURGICAL HISTORY:   Past Surgical History:  Procedure Laterality Date  . CORONARY ARTERY BYPASS GRAFT  2011  . ELBOW SURGERY  10/2017   Bursitis  . SALIVARY GLAND SURGERY  2016   Removal  due to cancer  . TONSILLECTOMY  1957    CURRENT MEDICATIONS:   Current Outpatient Medications  Medication Sig Dispense Refill  . aspirin EC 81 MG tablet Take 81 mg by mouth daily.    Marland Kitchen atenolol (TENORMIN) 100 MG tablet Take 100 mg by mouth daily.    Marland Kitchen atorvastatin (LIPITOR) 80 MG tablet Take 80 mg by mouth at bedtime.    . clopidogrel (PLAVIX) 75 MG tablet Take 75 mg by mouth daily.    Marland Kitchen ezetimibe (ZETIA) 10 MG tablet Take 10 mg by mouth daily.    Marland Kitchen levothyroxine (SYNTHROID) 150 MCG tablet Take 150 mcg by mouth daily before breakfast.    . lisinopril (ZESTRIL) 30 MG tablet Take 30 mg by mouth at bedtime.    . meclizine (ANTIVERT) 25 MG tablet Take 25 mg by mouth 3 (three) times daily as needed for dizziness.    . mirabegron ER (MYRBETRIQ) 50 MG TB24 tablet Take 50 mg by mouth daily.    . nitroGLYCERIN (NITROSTAT) 0.4 MG SL tablet Place 1 tablet (0.4 mg total) under the tongue every 5 (five) minutes as needed for chest pain. 25 tablet 5  . omeprazole (PRILOSEC) 20 MG capsule Take 20 mg by mouth daily as needed (heart burn).    . ondansetron (ZOFRAN-ODT) 4 MG disintegrating tablet Take 4 mg by mouth 4 (four) times daily  as needed for nausea.    . traZODone (DESYREL) 100 MG tablet Take 100 mg by mouth at bedtime.     No current facility-administered medications for this visit.    ALLERGIES:  No Known Allergies  FAMILY HISTORY:   Family History  Problem Relation Age of Onset  . Heart attack Mother   . Hypertension Mother   . CAD Mother   . Heart disease Father   . Heart attack Father     SOCIAL HISTORY:  The patient was born an raised in [place], [place].  He lives in town with his wife of 63 years.  He has 3 children and multiple grandchildren/great-grandchildren.  He was a previous high school golf and football coach.  He has 1-3 alcoholic drinks daily.  He does not smoke.  REVIEW OF SYSTEMS:  Review of Systems  Constitutional: Positive for unexpected weight change. Negative for  fatigue and fever.  Respiratory: Negative for chest tightness, cough, hemoptysis and shortness of breath.   Cardiovascular: Negative for chest pain and palpitations.  Gastrointestinal: Negative for abdominal distention, abdominal pain, blood in stool, constipation, diarrhea, nausea and vomiting.  Genitourinary: Negative for dysuria, frequency and hematuria.        Erectile dysfunction  Musculoskeletal: Negative for arthralgias, back pain and myalgias.  Skin: Negative for itching and rash.  Neurological: Negative for dizziness, headaches and light-headedness.  Psychiatric/Behavioral: Negative for depression and suicidal ideas. The patient is not nervous/anxious.      PHYSICAL EXAM:  Blood pressure 134/79, pulse 68, temperature 98 F (36.7 C), resp. rate 16, height 5\' 11"  (1.803 m), weight 153 lb 8 oz (69.6 kg), SpO2 97 %. Wt Readings from Last 3 Encounters:  12/18/20 153 lb 8 oz (69.6 kg)  12/11/20 152 lb (68.9 kg)  06/01/19 180 lb 9.6 oz (81.9 kg)   Body mass index is 21.41 kg/m. Performance status (ECOG): 1 Physical Exam Constitutional:      Appearance: Normal appearance. He is not ill-appearing.  HENT:     Mouth/Throat:     Mouth: Mucous membranes are moist.     Pharynx: Oropharynx is clear. No oropharyngeal exudate or posterior oropharyngeal erythema.  Cardiovascular:     Rate and Rhythm: Normal rate and regular rhythm.     Heart sounds: No murmur heard. No friction rub. No gallop.   Pulmonary:     Effort: Pulmonary effort is normal. No respiratory distress.     Breath sounds: Normal breath sounds. No wheezing, rhonchi or rales.  Chest:  Breasts:     Right: No axillary adenopathy or supraclavicular adenopathy.     Left: No axillary adenopathy or supraclavicular adenopathy.    Abdominal:     General: Bowel sounds are normal. There is no distension.     Palpations: Abdomen is soft. There is no mass.     Tenderness: There is no abdominal tenderness.  Musculoskeletal:         General: No swelling.     Right lower leg: No edema.     Left lower leg: No edema.  Lymphadenopathy:     Cervical: No cervical adenopathy.     Upper Body:     Right upper body: No supraclavicular or axillary adenopathy.     Left upper body: No supraclavicular or axillary adenopathy.     Lower Body: No right inguinal adenopathy. No left inguinal adenopathy.  Skin:    General: Skin is warm.     Coloration: Skin is not jaundiced.  Findings: No lesion or rash.  Neurological:     General: No focal deficit present.     Mental Status: He is alert and oriented to person, place, and time. Mental status is at baseline.     Cranial Nerves: Cranial nerves are intact.  Psychiatric:        Mood and Affect: Mood normal.        Behavior: Behavior normal.        Thought Content: Thought content normal.    ASSESSMENT & PLAN:  An 87 y.o. male who I was asked to consult upon for weight loss.  As mentioned previously, this gentleman does not seem particularly concerned with his weight loss.  There was nothing per his exam which concerned me for a malignancy being present.  If there is a heightened concern for an occult disease process being present, I would recommend that his primary care office order CT scans of his chest, abdomen, and pelvis.  As mentioned previously, he had normal peripheral counts.  This gentleman's fatigue may improve over time if he takes his folic acid and G62 like he was instructed.  Otherwise, as there are no pressing hematologic or oncologic issues, I will turn his care back over to his primary care office.  The patient understands all the plans discussed today and is in agreement with them.  I do appreciate Nicoletta Dress, MD for his new consult.   Nyliah Nierenberg Macarthur Critchley, MD

## 2020-12-18 ENCOUNTER — Inpatient Hospital Stay: Payer: Medicare PPO | Attending: Oncology | Admitting: Oncology

## 2020-12-18 ENCOUNTER — Other Ambulatory Visit: Payer: Self-pay

## 2020-12-18 DIAGNOSIS — R634 Abnormal weight loss: Secondary | ICD-10-CM | POA: Insufficient documentation

## 2020-12-18 DIAGNOSIS — Z8249 Family history of ischemic heart disease and other diseases of the circulatory system: Secondary | ICD-10-CM | POA: Diagnosis not present

## 2020-12-18 DIAGNOSIS — C080 Malignant neoplasm of submandibular gland: Secondary | ICD-10-CM | POA: Insufficient documentation

## 2020-12-18 HISTORY — DX: Abnormal weight loss: R63.4

## 2020-12-24 DIAGNOSIS — E538 Deficiency of other specified B group vitamins: Secondary | ICD-10-CM | POA: Diagnosis not present

## 2021-01-05 DIAGNOSIS — E538 Deficiency of other specified B group vitamins: Secondary | ICD-10-CM | POA: Diagnosis not present

## 2021-01-12 DIAGNOSIS — E538 Deficiency of other specified B group vitamins: Secondary | ICD-10-CM | POA: Diagnosis not present

## 2021-02-09 NOTE — Progress Notes (Signed)
Cardiology Office Note:    Date:  02/10/2021   ID:  Keith Watson., DOB 1934/05/08, MRN [id number]  PCP:  Nicoletta Dress, MD  Cardiologist:  Shirlee More, MD    Referring MD: Nicoletta Dress, MD    ASSESSMENT:    1. Coronary artery disease involving coronary bypass graft of native heart without angina pectoris   2. Essential hypertension   3. Mixed hyperlipidemia    PLAN:    In order of problems listed above:  Stable CAD having no angina following CABG [place] Heart Association class I continue medical therapy including aspirin clopidogrel with stroke beta-blocker and statin. Repeat blood pressure by me 142/90, his son will begin to have blood pressure checked at home trend for 2 weeks to send a copy to me a MyChart and continue his lisinopril.  If blood pressure is above target we will add calcium channel blocker Continue his statin and Zetia.   Next appointment: 1 year   Medication Adjustments/Labs and Tests Ordered: Current medicines are reviewed at length with the patient today.  Concerns regarding medicines are outlined above.  No orders of the defined types were placed in this encounter.  No orders of the defined types were placed in this encounter.   Chief Complaint  Patient presents with   Follow-up   Coronary Artery Disease    History of Present Illness:    Keith Watson. is a 87 y.o. male with a hx of CAD with CABG 2011 stroke 2020 hypertension and dyslipidemia.  Last seen 06/01/2019. He had duplex of the carotid arteries performed at [facility] 12/21/2018 impression was a large amount of left-sided atherosclerotic plaque with 50 to 69% luminal moderate narrowing of the left internal carotid artery and a moderate amount of right-sided atherosclerotic plaque without stenosis.  He also had an MRI of the brain which showed probable small focus of acute infarction in the right frontal lobe white matter and atrophy and chronic  ischemic changes the area in the right frontal lobe had increased signal on diffusion likely representing acute infarction.  He had a 14-day event monitor performed following stroke showing no evidence of atrial fibrillation.    Compliance with diet, lifestyle and medications: Yes  His son is present biggest problem is balance he uses a cane he has had no falls recently.  He has lost over 30 pounds but his weight is stable now.  Unfortunately I do not check blood pressure at home.  His son will sign up for my chart they will start checking blood pressure he will trend and send a list to me in 2 weeks.  Most recent labs 06/01/2019 cholesterol 147 LDL 64 triglycerides 94 HDL 66.  He is on a statin without muscle pain or weakness.  He has had no chest pain palpitations or shortness of breath  Recent labs PCP 12/15/2020 and hemoglobin of 17 platelets run at 39,000 creatinine 1.10 GFR 66 cc sodium 144 potassium 4.1 TSH normal 1.62 cholesterol 154 LDL 94 triglycerides 81 HDL 44  Recent venous duplex left lower extremity showed no findings of thrombophlebitis. 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[facility])    Chronic GERD    Controlled insomnia    Elevated fasting blood sugar    Erectile  disorder due to medical condition in male    Hyperlipidemia 01/08/2019   Hypertension 01/08/2019   Multi-vessel coronary artery stenosis    Olecranon bursitis of left elbow    PVD (peripheral vascular disease) ([place])    Thyroid disease 01/08/2019   TIA (transient ischemic attack)     Past Surgical History:  Procedure Laterality Date   CORONARY ARTERY BYPASS GRAFT  2011   ELBOW SURGERY  10/2017   Bursitis   SALIVARY GLAND SURGERY  2016   Removal due to cancer   TONSILLECTOMY  1957    Current Medications: Current Meds   Medication Sig   aspirin EC 81 MG tablet Take 81 mg by mouth daily.   atenolol (TENORMIN) 100 MG tablet Take 100 mg by mouth daily.   atorvastatin (LIPITOR) 80 MG tablet Take 80 mg by mouth at bedtime.   clopidogrel (PLAVIX) 75 MG tablet Take 75 mg by mouth daily.   ezetimibe (ZETIA) 10 MG tablet Take 10 mg by mouth daily.   levothyroxine (SYNTHROID) 150 MCG tablet Take 150 mcg by mouth daily before breakfast.   lisinopril (ZESTRIL) 30 MG tablet Take 30 mg by mouth at bedtime.   meclizine (ANTIVERT) 25 MG tablet Take 25 mg by mouth 3 (three) times daily as needed for dizziness.   mirabegron ER (MYRBETRIQ) 50 MG TB24 tablet Take 50 mg by mouth daily.   omeprazole (PRILOSEC) 20 MG capsule Take 20 mg by mouth daily as needed (heart burn).     Allergies:   Patient has no known allergies.   Social History   Socioeconomic History   Marital status: Married    Spouse name: Not on file   Number of children: Not on file   Years of education: Not on file   Highest education level: Not on file  Occupational History   Occupation: Physiological scientist    Comment: Retired  Tobacco Use   Smoking status: Former    Packs/day: 1.00    Years: 10.00    Pack years: 10.00    Types: Cigarettes    Quit date: 1970    Years since quitting: 52.5   Smokeless tobacco: Never   Tobacco comments:    quit 45 years  Vaping Use   Vaping Use: Never used  Substance and Sexual Activity   Alcohol use: Yes    Alcohol/week: 1.0 - 2.0 standard drink    Types: 1 - 2 Standard drinks or equivalent per week    Comment: 1-2 cocktails daily   Drug use: Never   Sexual activity: Not on file  Other Topics Concern   Not on file  Social History Narrative   Not on file   Social Determinants of Health   Financial Resource Strain: Not on file  Food Insecurity: Not on file  Transportation Needs: Not on file  Physical Activity: Not on file  Stress: Not on file  Social Connections: Not on file     Family  History: The patient's family history includes CAD in his mother; Heart attack in his father and mother; Heart disease in his father; Hypertension in his mother. ROS:   Please see the history of present illness.    All other systems reviewed and are negative.  EKGs/Labs/Other Studies Reviewed:    The following studies were reviewed today:  EKG:  EKG ordered today and personally reviewed.  The ekg ordered today demonstrates sinus rhythm right bundle branch block old inferior  Recent Labs: No results found for requested labs  within last 8760 hours.  Recent Lipid Panel    Component Value Date/Time   CHOL 147 06/01/2019 1142   TRIG 94 06/01/2019 1142   HDL 66 06/01/2019 1142   CHOLHDL 2.2 06/01/2019 1142   LDLCALC 64 06/01/2019 1142    Physical Exam:    VS:  BP (!) 148/98 (BP Location: Right Arm, Patient Position: Sitting, Cuff Size: Normal)   Pulse (!) 102   Ht 5\' 10"  (1.778 m)   Wt 155 lb 12.8 oz (70.7 kg)   SpO2 97%   BMI 22.35 kg/m     Wt Readings from Last 3 Encounters:  02/10/21 155 lb 12.8 oz (70.7 kg)  12/18/20 153 lb 8 oz (69.6 kg)  12/11/20 152 lb (68.9 kg)     GEN:  Well nourished, well developed in no acute distress HEENT: Normal NECK: No JVD; No carotid bruits LYMPHATICS: No lymphadenopathy CARDIAC: RRR, no murmurs, rubs, gallops RESPIRATORY:  Clear to auscultation without rales, wheezing or rhonchi  ABDOMEN: Soft, non-tender, non-distended MUSCULOSKELETAL:  No edema; No deformity  SKIN: Warm and dry NEUROLOGIC:  Alert and oriented x 3 PSYCHIATRIC:  Normal affect    Signed, Shirlee More, MD  02/10/2021 9:23 AM    [place]

## 2021-02-10 ENCOUNTER — Other Ambulatory Visit: Payer: Self-pay

## 2021-02-10 ENCOUNTER — Encounter: Payer: Self-pay | Admitting: Cardiology

## 2021-02-10 ENCOUNTER — Ambulatory Visit: Payer: Medicare PPO | Admitting: Cardiology

## 2021-02-10 VITALS — BP 148/98 | HR 102 | Ht 70.0 in | Wt 155.8 lb

## 2021-02-10 DIAGNOSIS — I10 Essential (primary) hypertension: Secondary | ICD-10-CM | POA: Diagnosis not present

## 2021-02-10 DIAGNOSIS — E782 Mixed hyperlipidemia: Secondary | ICD-10-CM

## 2021-02-10 DIAGNOSIS — I25810 Atherosclerosis of coronary artery bypass graft(s) without angina pectoris: Secondary | ICD-10-CM

## 2021-02-10 NOTE — Patient Instructions (Signed)
Medication Instructions:  No medication changes. *If you need a refill on your cardiac medications before your next appointment, please call your pharmacy*   Lab Work: Your physician recommends that you have labs done in the office today. Your test included  complete metabolic panel and lipids.  If you have labs (blood work) drawn today and your tests are completely normal, you will receive your results only by: [place] (if you have MyChart) OR A paper copy in the mail If you have any lab test that is abnormal or we need to change your treatment, we will call you to review the results.   Testing/Procedures: None ordered   Follow-Up: At [facility], you and your health needs are our priority.  As part of our continuing mission to provide you with exceptional heart care, we have created designated Provider Care Teams.  These Care Teams include your primary Cardiologist (physician) and Advanced Practice Providers (APPs -  Physician Assistants and Nurse Practitioners) who all work together to provide you with the care you need, when you need it.  We recommend signing up for the patient portal called "MyChart".  Sign up information is provided on this After Visit Summary.  MyChart is used to connect with patients for Virtual Visits (Telemedicine).  Patients are able to view lab/test results, encounter notes, upcoming appointments, etc.  Non-urgent messages can be sent to your provider as well.   To learn more about what you can do with MyChart, go to [email or web address].    Your next appointment:   12 month(s)  The format for your next appointment:   In Person  Provider:   Shirlee More, MD   Other Instructions NA

## 2021-02-11 DIAGNOSIS — I25810 Atherosclerosis of coronary artery bypass graft(s) without angina pectoris: Secondary | ICD-10-CM | POA: Diagnosis not present

## 2021-02-11 DIAGNOSIS — E538 Deficiency of other specified B group vitamins: Secondary | ICD-10-CM | POA: Diagnosis not present

## 2021-02-11 DIAGNOSIS — E782 Mixed hyperlipidemia: Secondary | ICD-10-CM | POA: Diagnosis not present

## 2021-02-11 DIAGNOSIS — I10 Essential (primary) hypertension: Secondary | ICD-10-CM | POA: Diagnosis not present

## 2021-02-12 LAB — COMPREHENSIVE METABOLIC PANEL
ALT: 18 IU/L (ref 0–44)
AST: 30 IU/L (ref 0–40)
Albumin/Globulin Ratio: 1.6 (ref 1.2–2.2)
Albumin: 4 g/dL (ref 3.6–4.6)
Alkaline Phosphatase: 109 IU/L (ref 44–121)
BUN/Creatinine Ratio: 10 (ref 10–24)
BUN: 11 mg/dL (ref 8–27)
Bilirubin Total: 1 mg/dL (ref 0.0–1.2)
CO2: 23 mmol/L (ref 20–29)
Calcium: 8.9 mg/dL (ref 8.6–10.2)
Chloride: 99 mmol/L (ref 96–106)
Creatinine, Ser: 1.07 mg/dL (ref 0.76–1.27)
Globulin, Total: 2.5 g/dL (ref 1.5–4.5)
Glucose: 82 mg/dL (ref 65–99)
Potassium: 4 mmol/L (ref 3.5–5.2)
Sodium: 138 mmol/L (ref 134–144)
Total Protein: 6.5 g/dL (ref 6.0–8.5)
eGFR: 68 mL/min/{1.73_m2} (ref >59)

## 2021-02-12 LAB — LIPID PANEL
Chol/HDL Ratio: 2.2 ratio (ref 0.0–5.0)
Cholesterol, Total: 156 mg/dL (ref 100–199)
HDL: 71 mg/dL (ref >39)
LDL Chol Calc (NIH): 70 mg/dL (ref 0–99)
Triglycerides: 77 mg/dL (ref 0–149)
VLDL Cholesterol Cal: 15 mg/dL (ref 5–40)

## 2021-02-19 DIAGNOSIS — L821 Other seborrheic keratosis: Secondary | ICD-10-CM | POA: Diagnosis not present

## 2021-02-19 DIAGNOSIS — L578 Other skin changes due to chronic exposure to nonionizing radiation: Secondary | ICD-10-CM | POA: Diagnosis not present

## 2021-02-19 DIAGNOSIS — L820 Inflamed seborrheic keratosis: Secondary | ICD-10-CM | POA: Diagnosis not present

## 2021-02-28 ENCOUNTER — Emergency Department (HOSPITAL_COMMUNITY): Payer: Medicare PPO

## 2021-02-28 ENCOUNTER — Encounter (HOSPITAL_COMMUNITY): Payer: Self-pay | Admitting: Emergency Medicine

## 2021-02-28 ENCOUNTER — Other Ambulatory Visit: Payer: Self-pay

## 2021-02-28 ENCOUNTER — Observation Stay (HOSPITAL_COMMUNITY)
Admission: EM | Admit: 2021-02-28 | Discharge: 2021-03-01 | Disposition: A | Discharge status: Home / Self Care | Payer: Medicare PPO | Attending: Emergency Medicine | Admitting: Emergency Medicine

## 2021-02-28 DIAGNOSIS — I714 Abdominal aortic aneurysm, without rupture, unspecified: Secondary | ICD-10-CM

## 2021-02-28 DIAGNOSIS — I712 Thoracic aortic aneurysm, without rupture, unspecified: Secondary | ICD-10-CM

## 2021-02-28 DIAGNOSIS — I719 Aortic aneurysm of unspecified site, without rupture: Secondary | ICD-10-CM

## 2021-02-28 DIAGNOSIS — I774 Celiac artery compression syndrome: Secondary | ICD-10-CM | POA: Diagnosis not present

## 2021-02-28 DIAGNOSIS — K449 Diaphragmatic hernia without obstruction or gangrene: Secondary | ICD-10-CM | POA: Diagnosis not present

## 2021-02-28 DIAGNOSIS — R457 State of emotional shock and stress, unspecified: Secondary | ICD-10-CM | POA: Diagnosis not present

## 2021-02-28 DIAGNOSIS — I723 Aneurysm of iliac artery: Secondary | ICD-10-CM | POA: Diagnosis not present

## 2021-02-28 DIAGNOSIS — E039 Hypothyroidism, unspecified: Secondary | ICD-10-CM | POA: Insufficient documentation

## 2021-02-28 DIAGNOSIS — I739 Peripheral vascular disease, unspecified: Secondary | ICD-10-CM | POA: Diagnosis present

## 2021-02-28 DIAGNOSIS — E785 Hyperlipidemia, unspecified: Secondary | ICD-10-CM | POA: Diagnosis present

## 2021-02-28 DIAGNOSIS — Z7902 Long term (current) use of antithrombotics/antiplatelets: Secondary | ICD-10-CM | POA: Insufficient documentation

## 2021-02-28 DIAGNOSIS — I716 Thoracoabdominal aortic aneurysm, without rupture: Secondary | ICD-10-CM | POA: Diagnosis not present

## 2021-02-28 DIAGNOSIS — K55059 Acute (reversible) ischemia of intestine, part and extent unspecified: Secondary | ICD-10-CM | POA: Diagnosis not present

## 2021-02-28 DIAGNOSIS — I7090 Unspecified atherosclerosis: Secondary | ICD-10-CM

## 2021-02-28 DIAGNOSIS — Z79899 Other long term (current) drug therapy: Secondary | ICD-10-CM | POA: Insufficient documentation

## 2021-02-28 DIAGNOSIS — Z87891 Personal history of nicotine dependence: Secondary | ICD-10-CM | POA: Insufficient documentation

## 2021-02-28 DIAGNOSIS — Z8673 Personal history of transient ischemic attack (TIA), and cerebral infarction without residual deficits: Secondary | ICD-10-CM | POA: Insufficient documentation

## 2021-02-28 DIAGNOSIS — I10 Essential (primary) hypertension: Secondary | ICD-10-CM | POA: Diagnosis present

## 2021-02-28 DIAGNOSIS — Z951 Presence of aortocoronary bypass graft: Secondary | ICD-10-CM | POA: Diagnosis not present

## 2021-02-28 DIAGNOSIS — Z7982 Long term (current) use of aspirin: Secondary | ICD-10-CM | POA: Diagnosis not present

## 2021-02-28 DIAGNOSIS — R0600 Dyspnea, unspecified: Secondary | ICD-10-CM | POA: Diagnosis not present

## 2021-02-28 DIAGNOSIS — R9431 Abnormal electrocardiogram [ECG] [EKG]: Secondary | ICD-10-CM | POA: Diagnosis not present

## 2021-02-28 DIAGNOSIS — R079 Chest pain, unspecified: Secondary | ICD-10-CM | POA: Diagnosis not present

## 2021-02-28 DIAGNOSIS — Z85858 Personal history of malignant neoplasm of other endocrine glands: Secondary | ICD-10-CM | POA: Diagnosis not present

## 2021-02-28 DIAGNOSIS — I2510 Atherosclerotic heart disease of native coronary artery without angina pectoris: Secondary | ICD-10-CM | POA: Insufficient documentation

## 2021-02-28 DIAGNOSIS — F419 Anxiety disorder, unspecified: Secondary | ICD-10-CM | POA: Diagnosis not present

## 2021-02-28 DIAGNOSIS — R251 Tremor, unspecified: Secondary | ICD-10-CM | POA: Diagnosis not present

## 2021-02-28 DIAGNOSIS — I708 Atherosclerosis of other arteries: Secondary | ICD-10-CM

## 2021-02-28 DIAGNOSIS — Z20822 Contact with and (suspected) exposure to covid-19: Secondary | ICD-10-CM | POA: Insufficient documentation

## 2021-02-28 DIAGNOSIS — I701 Atherosclerosis of renal artery: Secondary | ICD-10-CM | POA: Diagnosis not present

## 2021-02-28 HISTORY — DX: Aortic aneurysm of unspecified site, without rupture: I71.9

## 2021-02-28 HISTORY — DX: Unspecified atherosclerosis: I70.90

## 2021-02-28 LAB — CBC WITH DIFFERENTIAL/PLATELET
Abs Immature Granulocytes: 0.02 10*3/uL (ref 0.00–0.07)
Basophils Absolute: 0.1 10*3/uL (ref 0.0–0.1)
Basophils Relative: 1 %
Eosinophils Absolute: 0.1 10*3/uL (ref 0.0–0.5)
Eosinophils Relative: 1 %
HCT: 51.3 % (ref 39.0–52.0)
Hemoglobin: 16 g/dL (ref 13.0–17.0)
Immature Granulocytes: 0 %
Lymphocytes Relative: 17 %
Lymphs Abs: 1.1 10*3/uL (ref 0.7–4.0)
MCH: 31 pg (ref 26.0–34.0)
MCHC: 31.2 g/dL (ref 30.0–36.0)
MCV: 99.4 fL (ref 80.0–100.0)
Monocytes Absolute: 0.5 10*3/uL (ref 0.1–1.0)
Monocytes Relative: 7 %
Neutro Abs: 5.1 10*3/uL (ref 1.7–7.7)
Neutrophils Relative %: 74 %
Platelets: 184 10*3/uL (ref 150–400)
RBC: 5.16 MIL/uL (ref 4.22–5.81)
RDW: 14.7 % (ref 11.5–15.5)
WBC: 6.9 10*3/uL (ref 4.0–10.5)
nRBC: 0 % (ref 0.0–0.2)

## 2021-02-28 LAB — COMPREHENSIVE METABOLIC PANEL
ALT: 22 U/L (ref 0–44)
AST: 37 U/L (ref 15–41)
Albumin: 3.8 g/dL (ref 3.5–5.0)
Alkaline Phosphatase: 81 U/L (ref 38–126)
Anion gap: 16 — ABNORMAL HIGH (ref 5–15)
BUN: 11 mg/dL (ref 8–23)
CO2: 20 mmol/L — ABNORMAL LOW (ref 22–32)
Calcium: 9.1 mg/dL (ref 8.9–10.3)
Chloride: 102 mmol/L (ref 98–111)
Creatinine, Ser: 1.17 mg/dL (ref 0.61–1.24)
GFR, Estimated: >60 mL/min (ref >60)
Glucose, Bld: 69 mg/dL — ABNORMAL LOW (ref 70–99)
Potassium: 4.2 mmol/L (ref 3.5–5.1)
Sodium: 138 mmol/L (ref 135–145)
Total Bilirubin: 1.1 mg/dL (ref 0.3–1.2)
Total Protein: 6.9 g/dL (ref 6.5–8.1)

## 2021-02-28 LAB — URINALYSIS, ROUTINE W REFLEX MICROSCOPIC
Bacteria, UA: NONE SEEN
Bilirubin Urine: NEGATIVE
Glucose, UA: NEGATIVE mg/dL
Hgb urine dipstick: NEGATIVE
Ketones, ur: 20 mg/dL — AB
Leukocytes,Ua: NEGATIVE
Nitrite: NEGATIVE
Protein, ur: NEGATIVE mg/dL
Specific Gravity, Urine: 1.036 — ABNORMAL HIGH (ref 1.005–1.030)
pH: 6 (ref 5.0–8.0)

## 2021-02-28 LAB — TROPONIN I (HIGH SENSITIVITY)
Troponin I (High Sensitivity): 15 ng/L (ref <18)
Troponin I (High Sensitivity): 20 ng/L — ABNORMAL HIGH (ref <18)

## 2021-02-28 LAB — LIPID PANEL
Cholesterol: 192 mg/dL (ref 0–200)
HDL: 68 mg/dL (ref >40)
LDL Cholesterol: 107 mg/dL — ABNORMAL HIGH (ref 0–99)
Total CHOL/HDL Ratio: 2.8 RATIO
Triglycerides: 87 mg/dL (ref <150)
VLDL: 17 mg/dL (ref 0–40)

## 2021-02-28 LAB — RESP PANEL BY RT-PCR (FLU A&B, COVID) ARPGX2
Influenza A by PCR: NEGATIVE
Influenza B by PCR: NEGATIVE
SARS Coronavirus 2 by RT PCR: NEGATIVE

## 2021-02-28 LAB — HEMOGLOBIN A1C
Hgb A1c MFr Bld: 5.3 % (ref 4.8–5.6)
Mean Plasma Glucose: 105.41 mg/dL

## 2021-02-28 LAB — CBG MONITORING, ED: Glucose-Capillary: 176 mg/dL — ABNORMAL HIGH (ref 70–99)

## 2021-02-28 MED ORDER — TRAZODONE HCL 100 MG PO TABS
100.0000 mg | ORAL_TABLET | Freq: Every day | ORAL | Status: DC
  Administered 2021-02-28: 100 mg via ORAL
  Filled 2021-02-28: qty 1

## 2021-02-28 MED ORDER — ACETAMINOPHEN 325 MG PO TABS: 650.0000 mg | ORAL_TABLET | Freq: Four times a day (QID) | ORAL | Status: DC | PRN

## 2021-02-28 MED ORDER — CLOPIDOGREL BISULFATE 75 MG PO TABS
75.0000 mg | ORAL_TABLET | Freq: Every day | ORAL | Status: DC
  Administered 2021-03-01: 75 mg via ORAL
  Filled 2021-02-28: qty 1

## 2021-02-28 MED ORDER — NITROGLYCERIN 0.4 MG SL SUBL: 0.4000 mg | SUBLINGUAL_TABLET | SUBLINGUAL | Status: DC | PRN

## 2021-02-28 MED ORDER — SODIUM CHLORIDE 0.9 % IV SOLN: 250.0000 mL | INTRAVENOUS | Status: DC | PRN

## 2021-02-28 MED ORDER — MIRABEGRON ER 50 MG PO TB24
50.0000 mg | ORAL_TABLET | Freq: Every day | ORAL | Status: DC
  Administered 2021-03-01: 50 mg via ORAL
  Filled 2021-02-28: qty 1

## 2021-02-28 MED ORDER — ATORVASTATIN CALCIUM 80 MG PO TABS
80.0000 mg | ORAL_TABLET | Freq: Every day | ORAL | Status: DC
  Administered 2021-02-28: 80 mg via ORAL
  Filled 2021-02-28: qty 1

## 2021-02-28 MED ORDER — IOHEXOL 350 MG/ML SOLN
100.0000 mL | Freq: Once | INTRAVENOUS | Status: AC | PRN
  Administered 2021-02-28: 100 mL via INTRAVENOUS

## 2021-02-28 MED ORDER — HYDRALAZINE HCL 20 MG/ML IJ SOLN
10.0000 mg | Freq: Once | INTRAMUSCULAR | Status: AC
  Administered 2021-02-28: 10 mg via INTRAVENOUS
  Filled 2021-02-28: qty 1

## 2021-02-28 MED ORDER — LISINOPRIL 20 MG PO TABS
20.0000 mg | ORAL_TABLET | Freq: Once | ORAL | Status: AC
  Administered 2021-02-28: 20 mg via ORAL
  Filled 2021-02-28: qty 1

## 2021-02-28 MED ORDER — LISINOPRIL 20 MG PO TABS: 30.0000 mg | ORAL_TABLET | Freq: Every day | ORAL | Status: DC

## 2021-02-28 MED ORDER — ACETAMINOPHEN 650 MG RE SUPP: 650.0000 mg | Freq: Four times a day (QID) | RECTAL | Status: DC | PRN

## 2021-02-28 MED ORDER — SODIUM CHLORIDE 0.9% FLUSH: 3.0000 mL | INTRAVENOUS | Status: DC | PRN

## 2021-02-28 MED ORDER — ENOXAPARIN SODIUM 40 MG/0.4ML IJ SOSY
40.0000 mg | PREFILLED_SYRINGE | INTRAMUSCULAR | Status: DC
  Administered 2021-03-01: 40 mg via SUBCUTANEOUS
  Filled 2021-02-28: qty 0.4

## 2021-02-28 MED ORDER — ATENOLOL 25 MG PO TABS: 100.0000 mg | ORAL_TABLET | Freq: Every day | ORAL | Status: DC

## 2021-02-28 MED ORDER — ATENOLOL 25 MG PO TABS
100.0000 mg | ORAL_TABLET | Freq: Every day | ORAL | Status: DC
  Administered 2021-02-28: 100 mg via ORAL
  Filled 2021-02-28: qty 4

## 2021-02-28 MED ORDER — LEVOTHYROXINE SODIUM 75 MCG PO TABS
150.0000 ug | ORAL_TABLET | Freq: Every day | ORAL | Status: DC
  Administered 2021-03-01: 150 ug via ORAL
  Filled 2021-02-28: qty 2

## 2021-02-28 MED ORDER — SODIUM CHLORIDE 0.9% FLUSH
3.0000 mL | Freq: Two times a day (BID) | INTRAVENOUS | Status: DC
  Administered 2021-02-28 – 2021-03-01 (×2): 3 mL via INTRAVENOUS

## 2021-02-28 MED ORDER — ASPIRIN EC 81 MG PO TBEC
81.0000 mg | DELAYED_RELEASE_TABLET | Freq: Every day | ORAL | Status: DC
  Administered 2021-03-01: 81 mg via ORAL
  Filled 2021-02-28: qty 1

## 2021-02-28 MED ORDER — EZETIMIBE 10 MG PO TABS
10.0000 mg | ORAL_TABLET | Freq: Every day | ORAL | Status: DC
  Administered 2021-03-01: 10 mg via ORAL
  Filled 2021-02-28: qty 1

## 2021-02-28 NOTE — ED Notes (Signed)
Admitting MD at bedside.

## 2021-02-28 NOTE — Consult Note (Addendum)
Cardiology Consultation:   Patient ID: Keith Watson. MRN: [id number]; DOB: February 10, 1934  Admit date: 02/28/2021 Date of Consult: 02/28/2021  Primary Care Provider: Nicoletta Dress, MD [facility] Cardiologist: Keith More, MD  Keith Watson Electrophysiologist:  None   Patient Profile:   Keith Watson. is an [id number] with CAD s/p CABG (2011), prior CVA, HLD, HTN, any hypothyroidism who presents with tremors.   History of Present Illness:   Keith Watson reported that he was in his normal state of health until he woke up this morning with bilateral hand and feet tremors which she had never experienced before.  This lasted around an hour and then went away once he got to his PCPs office (Dr. Delena Bali, [facility]).  They obtained an ECG which we do not have on file here but reportedly showed RBBB and prior inferior infarct which is consistent with his recent clinic ECG and ECG on presentation.  He was taken by [facility] from his PCP office to the ED for further evaluation. He denied any associated symptoms such as dizziness, headache, bowel/bladder incontinence, altered mental status or palpitations.  He also denied any chest pain/pressure or diaphoresis.  Cardiology was consulted as there was concern that this episode was similar to his prior presentation leading to his CABG in 2011.  Keith Watson was fairly certain that his symptoms surrounding his CABG were predominantly reflux symptoms with belching and hiccups but he denied any tremor similar to today.  He described the sensation as a jittery but said that he could not really control the tremors in his hands or feet.   He has known CAD, distant prior tobacco use (20 py, >45 years ago), moderately controlled HTN, HLD, and prior CVA.  During my evaluation he was symptom-free and his vital signs were only significant for moderate HTN (HR 71, BP 150/74 (91), 95% RA, RR 23).  There was reportedly sBP around 200 on initial  presentation. He was given his home anti-HTN (atenolol 100 mg PO, lisinopril 20 mg PO-prescribed 30 mg PO OP) along with hydral 10 mg IV x1. He was normotensive 120s/80s following these. He had not taken his medications 07/30 AM as he headed to the PCP for further evaluation.  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[facility])    Chronic GERD    Controlled insomnia    Elevated fasting blood sugar    Erectile disorder due to medical condition in male    Hyperlipidemia 01/08/2019   Hypertension 01/08/2019   Multi-vessel coronary artery stenosis    Olecranon bursitis of left elbow    PVD (peripheral vascular disease) ([place])    Thyroid disease 01/08/2019   TIA (transient ischemic attack)     Past Surgical History:  Procedure Laterality Date   CORONARY ARTERY BYPASS GRAFT  2011   ELBOW SURGERY  10/2017   Bursitis   SALIVARY GLAND SURGERY  2016   Removal due to cancer   TONSILLECTOMY  1957     Home Medications:  Prior to Admission medications   Medication Sig Start Date End Date Taking? Authorizing Provider  aspirin EC 81 MG tablet Take 81 mg by mouth daily.   Yes [provider]  atenolol (TENORMIN) 100 MG tablet Take 100 mg by mouth daily. 09/24/18  Yes [provider]  atorvastatin (LIPITOR) 80 MG tablet Take 80 mg by mouth at bedtime. 07/21/18  Yes [provider]  clopidogrel (PLAVIX) 75 MG tablet Take 75 mg by mouth daily.   Yes [provider]  cyanocobalamin (,VITAMIN B-12,) 1000 MCG/ML injection Inject 1,000 mcg into the muscle every 30 (thirty) days.   Yes [provider]  ezetimibe (ZETIA) 10 MG tablet Take 10 mg by mouth daily.   Yes [provider]  levothyroxine (SYNTHROID) 150 MCG tablet Take 150 mcg by mouth daily before breakfast.   Yes  [provider]  lisinopril (ZESTRIL) 30 MG tablet Take 30 mg by mouth at bedtime. 09/18/20  Yes [provider]  meclizine (ANTIVERT) 25 MG tablet Take 25 mg by mouth 3 (three) times daily as needed for dizziness. 10/31/20  Yes [provider]  mirabegron ER (MYRBETRIQ) 50 MG TB24 tablet Take 50 mg by mouth daily.   Yes [provider]  nitroGLYCERIN (NITROSTAT) 0.4 MG SL tablet Place 1 tablet (0.4 mg total) under the tongue every 5 (five) minutes as needed for chest pain. 06/01/19 04/11/21 Yes Richardo Priest, MD  traZODone (DESYREL) 100 MG tablet Take 100 mg by mouth at bedtime.   Yes [provider]    Inpatient Medications: Scheduled Meds:  atenolol  100 mg Oral Daily   Continuous Infusions:  PRN Meds:  Allergies:   No Known Allergies  Social History:   Social History   Socioeconomic History   Marital status: Married    Spouse name: Not on file   Number of children: Not on file   Years of education: Not on file   Highest education level: Not on file  Occupational History   Occupation: Physiological scientist    Comment: Retired  Tobacco Use   Smoking status: Former    Packs/day: 1.00    Years: 10.00    Pack years: 10.00    Types: Cigarettes    Quit date: 1970    Years since quitting: 52.6   Smokeless tobacco: Never   Tobacco comments:    quit 45 years  Vaping Use   Vaping Use: Never used  Substance and Sexual Activity   Alcohol use: Yes    Alcohol/week: 1.0 - 2.0 standard drink    Types: 1 - 2 Standard drinks or equivalent per week    Comment: 1-2 cocktails daily   Drug use: Never   Sexual activity: Not on file  Other Topics Concern   Not on file  Social History Narrative   Not on file   Social Determinants of Health   Financial Resource Strain: Not on file  Food Insecurity: Not on file  Transportation Needs: Not on file  Physical Activity: Not on file  Stress: Not on file  Social Connections: Not on file   Intimate Partner Violence: Not on file    Family History:    Family History  Problem Relation Age of Onset   Heart attack Mother    Hypertension Mother    CAD Mother    Heart disease Father    Heart attack Father     ROS:  Review of Systems: [y] = yes, '[ ]'$  = no      General: Weight gain '[ ]'$ ; Weight loss '[ ]'$ ; Anorexia '[ ]'$ ; Fatigue '[ ]'$ ; Fever '[ ]'$ ; Chills '[ ]'$ ; Weakness '[ ]'$    Cardiac: Chest pain/pressure '[ ]'$ ; Resting SOB '[ ]'$ ; Exertional SOB '[ ]'$ ; Orthopnea '[ ]'$ ;  Pedal Edema '[ ]'$ ; Palpitations '[ ]'$ ; Syncope '[ ]'$ ; Presyncope '[ ]'$ ; Paroxysmal nocturnal dyspnea '[ ]'$    Pulmonary: Cough '[ ]'$ ; Wheezing '[ ]'$ ; Hemoptysis '[ ]'$ ; Sputum '[ ]'$ ; Snoring '[ ]'$    GI: Vomiting '[ ]'$ ; Dysphagia '[ ]'$ ; Melena '[ ]'$ ; Hematochezia '[ ]'$ ; Heartburn '[ ]'$ ; Abdominal pain '[ ]'$ ; Constipation '[ ]'$ ; Diarrhea '[ ]'$ ; BRBPR '[ ]'$    GU: Hematuria '[ ]'$ ; Dysuria '[ ]'$ ; Nocturia '[ ]'$  Vascular: Pain in legs with walking '[ ]'$ ; Pain in feet with lying flat '[ ]'$ ; Non-healing sores '[ ]'$ ; Stroke '[ ]'$ ; TIA '[ ]'$ ; Slurred speech '[ ]'$ ;   Neuro: Headaches '[ ]'$ ; Vertigo '[ ]'$ ; Seizures '[ ]'$ ; Paresthesias '[ ]'$ ;Blurred vision '[ ]'$ ; Diplopia '[ ]'$ ; Vision changes '[ ]'$  Tremors '[x]'$     Ortho/Skin: Arthritis '[ ]'$ ; Joint pain '[ ]'$ ; Muscle pain '[ ]'$ ; Joint swelling '[ ]'$ ; Back Pain '[ ]'$ ; Rash '[ ]'$    Psych: Depression '[ ]'$ ; Anxiety '[ ]'$    Heme: Bleeding problems '[ ]'$ ; Clotting disorders '[ ]'$ ; Anemia '[ ]'$    Endocrine: Diabetes '[ ]'$ ; Thyroid dysfunction '[ ]'$    Physical Exam/Data:   Vitals:   02/28/21 1545 02/28/21 1715 02/28/21 1730 02/28/21 1845  BP: (!) 196/132 (!) 184/129 (!) 176/91 124/67  Pulse: 67 69 63 75  Resp: 18 (!) 22 20 (!) 26  Temp:      TempSrc:      SpO2: 94% 95% 95% 94%   No intake or output data in the 24 hours ending 02/28/21 1905 Last 3 Weights 02/10/2021 12/18/2020 12/11/2020  Weight (lbs) 155 lb 12.8 oz 153 lb 8 oz 152 lb  Weight (kg) 70.67 kg 69.627 kg 68.947 kg     There is no height or weight on file to calculate BMI.  General:  Well nourished, well developed, in no acute  distress HEENT: normal Lymph: no adenopathy Neck: no JVD Endocrine:  No thryomegaly Vascular: No carotid bruits; FA pulses 2+ bilaterally without bruits  Cardiac:  2/6 systolic murmur, RUSB most prominent  Lungs:  clear to auscultation bilaterally, no wheezing, rhonchi or rales  Abd: soft, nontender, no hepatomegaly  Ext: no edema Musculoskeletal:  No deformities, BUE and BLE strength normal and equal Skin: warm and dry  Neuro:  CNs 2-12 intact, no focal abnormalities noted Psych:  Normal affect   EKG:  The EKG was personally reviewed and demonstrates: RBBB, inferior Q waves  Telemetry:  Telemetry was personally reviewed and demonstrates: NSR  Relevant CV Studies: None   Laboratory Data:  High Sensitivity Troponin:   Recent Labs  Lab 02/28/21 1038 02/28/21 1359  TROPONINIHS 15 20*     Chemistry Recent Labs  Lab 02/28/21 1038  NA 138  K 4.2  CL 102  CO2 20*  GLUCOSE 69*  BUN 11  CREATININE 1.17  CALCIUM 9.1  GFRNONAA >60  ANIONGAP 16*    Recent Labs  Lab 02/28/21 1038  PROT 6.9  ALBUMIN 3.8  AST 37  ALT 22  ALKPHOS 81  BILITOT 1.1   Hematology Recent Labs  Lab 02/28/21 1038  WBC 6.9  RBC 5.16  HGB 16.0  HCT 51.3  MCV 99.4  MCH 31.0  MCHC 31.2  RDW 14.7  PLT 184   BNPNo results for input(s): BNP, PROBNP in the last 168 hours.  DDimer No results for input(s): DDIMER in the last 168 hours.  Radiology/Studies:  DG Chest 1 View  Result Date: 02/28/2021 CLINICAL DATA:  Patient  woke up feeling anxious and shaky this morning. No pain. EKG changes. EXAM: CHEST  1 VIEW COMPARISON:  June 19, 2010 FINDINGS: There is a convexity to the descending thoracic aorta not seen on the comparison study. The remainder of the thoracic aorta is tortuous but unchanged. The hila and mediastinum are unremarkable. The heart size is normal. No pneumothorax. No suspicious nodules or masses. No focal infiltrates. IMPRESSION: 1. There is a new convexity to the descending  thoracic aorta raising the possibility of aneurysm/aortic pathology. Recommend a CT angiogram of the chest to better evaluate the aorta. 2. No other abnormalities. Electronically Signed   By: Dorise Bullion III M.D   On: 02/28/2021 11:24   CT Angio Chest/Abd/Pel for Dissection W and/or Wo Contrast  Result Date: 02/28/2021 CLINICAL DATA:  Epigastric abdominal pain. New convexity of the descending thoracic aorta on a chest radiograph today. Clinical concern for aortic dissection. EXAM: CT ANGIOGRAPHY CHEST, ABDOMEN AND PELVIS TECHNIQUE: Non-contrast CT of the chest was initially obtained. Multidetector CT imaging through the chest, abdomen and pelvis was performed using the standard protocol during bolus administration of intravenous contrast. Multiplanar reconstructed images and MIPs were obtained and reviewed to evaluate the vascular anatomy. CONTRAST:  177m OMNIPAQUE IOHEXOL 350 MG/ML SOLN COMPARISON:  Chest radiographs obtained earlier today and on 06/19/2010. Chest CT dated 12/15/2009. FINDINGS: CTA CHEST FINDINGS Cardiovascular: Focal aneurysmal dilatation of the mid descending thoracic aorta with a maximum diameter of 5.2 cm on image number 85/5. This contains mural thrombus. Mild aneurysmal dilatation of the aortic arch, measuring 4.2 cm in maximum diameter on image number 44/5. The ascending thoracic aorta and distal descending thoracic aorta are normal in caliber. No dissection seen. Mildly enlarged heart. Atheromatous calcifications, including the coronary arteries and aorta. Post CABG changes. Mediastinum/Nodes: No enlarged mediastinal, hilar, or axillary lymph nodes. Thyroid gland, trachea, and esophagus demonstrate no significant findings. Moderate-sized hiatal hernia. Lungs/Pleura: Interval multiple small nodular densities in the inferior right lower lobe. These include a 3 mm nodule on image number 97/9 in multiple adjacent nodules in the as ago esophageal recess the medial right lung base, the  largest measuring 9 mm on image number 118/9. No nodules elsewhere in either lung. No pleural fluid. Mild left lower lobe paraseptal bullous emphysema. Musculoskeletal: Thoracic and lower cervical spine degenerative changes. Median sternotomy wires. Review of the MIP images confirms the above findings. CTA ABDOMEN AND PELVIS FINDINGS VASCULAR Aorta: Aneurysmal dilatation of the proximal infrarenal abdominal aorta and distal infrarenal abdominal aorta with mural thrombus at both locations. Proximally, this measures 4.4 cm in maximum diameter on image 192/5. Distally, this measures 4.1 cm in maximum diameter on image number 218/5. No dissection seen. Celiac: Calcified and noncalcified plaque at the origin of the celiac axis causing approximately 90% stenosis with normal opacification distally. SMA: Calcified plaque at the origin causing approximately 60% stenosis. Normal opacification distally. Renals: Plaque at the origin of the left renal artery producing approximately 60% stenosis. Two right renal arteries. Plaque formation at the origin of the Watson superior right renal artery producing approximately 90% stenosis. Plaque formation at the origin of the Watson inferior artery causing approximately 70% stenosis. IMA: Plaque formation at the origin producing approximately 80% stenosis. Inflow: Minimal fusiform aneurysmal dilatation of the left common iliac artery with a maximum diameter of 1.7 cm. Focal aneurysmal dilatation of the distal right common iliac artery with a maximum diameter of 2.0 cm. Proximal left internal iliac artery aneurysm with extensive mural thrombus, measuring 4.1  cm in maximum diameter on image number 250/5. There is a 2nd area of focal aneurysmal dilatation of the distal left internal iliac artery with extensive mural thrombus, measuring 3.8 cm in maximum diameter on image number 27/5. The lumen is patent through these areas. Additional atheromatous changes involving the rest of the iliac  arteries bilaterally and proximal femoral arteries without significant stenosis. Veins: No obvious venous abnormality within the limitations of this arterial phase study. Review of the MIP images confirms the above findings. NON-VASCULAR Hepatobiliary: Mild diffuse low density of the liver relative to the spleen. Normal appearing gallbladder. Pancreas: Moderate diffuse pancreatic atrophy. Spleen: Normal in size without focal abnormality. Adrenals/Urinary Tract: Normal appearing adrenal glands. Upper pole right renal cyst. Normal appearing left kidney. Unremarkable bladder and ureters. Stomach/Bowel: Moderate-sized hiatal hernia. Multiple colonic diverticula without evidence of diverticulitis. No evidence of appendicitis. Unremarkable small bowel. Lymphatic: No enlarged lymph nodes. Reproductive: Mildly to moderately enlarged prostate gland. Other: Small umbilical hernia containing fat. Musculoskeletal: Lumbar spine degenerative changes including facet degenerative changes with associated grade 1 anterolisthesis at the L4-5 level. No fractures or pars defects. Review of the MIP images confirms the above findings. IMPRESSION: 1. No aortic dissection or other acute abnormality. 2. Interval aneurysmal dilatation of the aortic arch and proximal and mid descending thoracic aorta, most pronounced at the level of the midthoracic aorta with a maximum diameter of 5.2 cm. The distal arch measures 4.2 cm in maximum diameter. Recommend semi-annual imaging followup by CTA or MRA and referral to cardiothoracic surgery if not already obtained. This recommendation follows 2010 ACCF/AHA/AATS/ACR/ASA/SCA/SCAI/SIR/STS/SVM Guidelines for the Diagnosis and Management of Patients With Thoracic Aortic Disease. Circulation. 2010; [id number]. Aortic aneurysm NOS (ICD10-I71.9) 3. Focal aneurysmal dilatation of the proximal and distal portions of the infrarenal abdominal aorta with a maximum diameter of 4.4 cm proximally and 4.1 cm  distally. Recommend follow-up every 12 months and vascular consultation. This recommendation follows ACR consensus guidelines: White Paper of the ACR Incidental Findings Committee II on Vascular Findings. J Am Coll Radiol 2013; 10:789-794. 4. 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 5. Plaque formation producing approximately 90% stenosis of the origin of the celiac axis, 60% stenosis of the origin of the superior mesenteric artery, 60% stenosis of the origin of the left renal artery, 90% stenosis of the origin of the Watson cephalad right renal artery, 70% stenosis of the origin of the Watson caudal right renal artery and 80% stenosis of the origin of the inferior mesenteric artery. 6.  Calcific coronary artery and aortic atherosclerosis. 7. Mild cardiomegaly. 8. Moderate-sized hiatal hernia. 9. Interval multiple nodules in the right lower lobe. Non-contrast chest CT at 3-6 months is recommended. If the nodules are stable at time of repeat CT, then future CT at 18-24 months (from today's scan) is considered optional for low-risk patients, but is recommended for high-risk patients. This recommendation follows the consensus statement: Guidelines for Management of Incidental Pulmonary Nodules Detected on CT Images: From the Fleischner Society 2017; Radiology 2017; 284:228-243. 10. Mild paraseptal bullous emphysema. 11. Mild hepatic steatosis. Electronically Signed   By: Claudie Revering M.D.   On: 02/28/2021 15:33   {  Assessment and Plan:   Tremors Keith Watson's story does not seem consistent with cardiac chest pain and does not require additional risk stratification as he does not have recent history of symptoms consistent with  cardiac chest pain that would prompt further evaluation.  His prior presentation of ACS consisted of hiccups and belching without any associated tremors.   Lab work remarkable only for a mild hsT leak, mild AG metabolic acidosis (AG 16) and ketonuria. His hsT was very mildly elevated which is likely in the context of HTN after not taking any of his morning medications with a sBP >200 on initial presentation.  Given his age and moderately controlled HTN I would ideally like to change his HTN regimen from atenolol+lisinopril (atenolol 100 mg PO daily, lisinopril 30 mg PO daily) to lisinopril+amlodipine.  He has had some gait instability in the past which is likely multifactorial but is probably not helped by a moderate to high dose of atenolol.  We could add carvedilol if needed although from a CAD standpoint is over 10 years since his ischemic event with no recurrent anginal equivalents. - defer HTN management to GM however would preference getting off atenolol and starting higher dose lisinopril with addition of amlodipine for above reasons  - no further cardiac evaluation indicated given he has no sx that correlate with prior cardiac complaints and no s/s of HF  - A1c/lipids added for OP f/u   2. HTN  Given his age and moderately controlled HTN I would ideally like to change his HTN regimen from atenolol+lisinopril (atenolol 100 mg PO daily, lisinopril 30 mg PO daily) to lisinopril+amlodipine.  He has had some gait instability in the past which is likely multifactorial but is probably not helped by a moderate to high dose of atenolol.  We could add carvedilol if needed although from a CAD standpoint is over 10 years since his ischemic event with no recurrent anginal equivalents. - defer HTN management to GM however would preference getting off atenolol and starting higher dose lisinopril with addition of amlodipine for above reasons   3. Hypothyroidism  - TSH added to ensure he isn't hyperthyroid given synthroid supplementation and tremors   4. AAA Incidentally identified on CT scan, max diameter 5.2 cm. Will need q60mosurveillance [place] [facility] planning to  consult [facility] so defer management to them. 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  - aggressive RF modification given multiple areas of stenosis - surveillance as above for AAA - f/u [facility] for AAA  [facility] will sign off.   Medication Recommendations: increase lisinopril to 40 mg PO daily, add amlodipine 5 mg PO daily, d/c atenolol 100 mg PO daily  Other recommendations (labs, testing, etc): none  Follow up as an outpatient: routine f/u with Dr. MBettina Gavia  For questions or updates, please contact CWoodsonPlease consult [email or web address] for contact info under   Signed, MDion Body MD  02/28/2021 7:05 PM

## 2021-02-28 NOTE — Consult Note (Signed)
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  The distal arch measures 4.2 cm in maximum diameter.  There is a more prominent thoracic aneurysm in the mid thoracic aorta which measures 5.2 cm in maximum diameter.  If the dilated aortic arch and thoracic aortic aneurysm enlarge significantly he would be have to be considered for repair and be evaluated by cardiothoracic surgery.  Currently the aneurysm was are not large enough to consider repair and he is agreeable to simply have me repeat a follow-up study in 6 months.  If the aneurysm is doing large that he would have to be referred for consideration for repair.   ABDOMINAL AORTIC ANEURYSM: The patient has a 4.4 cm infrarenal abdominal aortic aneurysm.  The aneurysm extends up near the left renal artery.  In a normal risk patient we would consider elective repair of an infrarenal aneurysm of 5.5 cm.  If this enlarge significantly he would have to be considered for elective repair.  Given that the aneurysm extends near the iliac vein there does not appear to be an adequate neck for a standard endovascular aneurysm repair.  He could be referred to [facility] in the future if the aneurysm enlarges significantly to be considered for a fenestrated graft.  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  At this enlarged this could potentially be coil embolized.  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  He has a moderate 60% stenosis at the origin of the superior mesenteric artery, and also a stenosis in the inferior mesenteric artery.  However he is asymptomatic.  He denies any postprandial abdominal pain.  Certainly I would not recommend an aggressive approach to this and likely became symptomatic.  RENAL ARTERY STENOSIS: His CT scan shows a 60% stenosis at the origin of the left renal artery.  There is a 90% stenosis at the origin of the more  cephalad right renal artery and a 70% stenosis at the origin of the more caudal right renal artery.  Currently his blood pressure appears to be under good control.  I would not recommend an aggressive approach to this unless he develops significant problems with hypertension.  REASON FOR CONSULT:    Multiple vascular issues.  The consult is requested by the emergency department.  HPI:   Keith Watson. is a 87 y.o. male who tells me that he is being admitted to the hospital because of hypertension.  He denies any chest pain.  He denies any abdominal pain currently.  However reportedly he may have been complaining of these things earlier this prompted a CT angiogram of the chest abdomen and pelvis to rule out dissection.  He had multiple incidental findings including a thoracic aortic aneurysm, abdominal aortic aneurysm, mesenteric artery occlusive disease, and renal artery stenoses.  Vascular surgery was consulted to weigh in on his multiple vascular issues and arrange follow-up.  On my history he denies any chest pain or shortness of breath.  He denies any abdominal pain or back pain.  He denies any postprandial abdominal pain.  He has lost weight over the last year and is not sure why.  He states that his appetite is fine.  His risk factors for peripheral vascular disease include hypertension, hypercholesterolemia.  He denies any history of diabetes, family history of premature cardiovascular disease, or tobacco use.  He had a myocardial infarction 15 years  ago.  He denies any history of congestive heart failure.  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HCC)    Chronic GERD    Controlled insomnia    Elevated fasting blood sugar    Erectile disorder due to medical condition in male     Hyperlipidemia 01/08/2019   Hypertension 01/08/2019   Multi-vessel coronary artery stenosis    Olecranon bursitis of left elbow    PVD (peripheral vascular disease) ([name])    Thyroid disease 01/08/2019   TIA (transient ischemic attack)     Family History  Problem Relation Age of Onset   Heart attack Mother    Hypertension Mother    CAD Mother    Heart disease Father    Heart attack Father     SOCIAL HISTORY: Social History   Tobacco Use   Smoking status: Former    Packs/day: 1.00    Years: 10.00    Pack years: 10.00    Types: Cigarettes    Quit date: 1970    Years since quitting: 52.6   Smokeless tobacco: Never   Tobacco comments:    quit 45 years  Substance Use Topics   Alcohol use: Yes    Alcohol/week: 1.0 - 2.0 standard drink    Types: 1 - 2 Standard drinks or equivalent per week    Comment: 1-2 cocktails daily    No Known Allergies  Current Facility-Administered Medications  Medication Dose Route Frequency Provider Last Rate Last Admin   atenolol (TENORMIN) tablet 100 mg  100 mg Oral Daily Wyvonnia Dusky, MD   100 mg at 02/28/21 1348   Current Outpatient Medications  Medication Sig Dispense Refill   aspirin EC 81 MG tablet Take 81 mg by mouth daily.     atenolol (TENORMIN) 100 MG tablet Take 100 mg by mouth daily.     atorvastatin (LIPITOR) 80 MG tablet Take 80 mg by mouth at bedtime.     clopidogrel (PLAVIX) 75 MG tablet Take 75 mg by mouth daily.     cyanocobalamin (,VITAMIN B-12,) 1000 MCG/ML injection Inject 1,000 mcg into the muscle every 30 (thirty) days.     ezetimibe (ZETIA) 10 MG tablet Take 10 mg by mouth daily.     levothyroxine (SYNTHROID) 150 MCG tablet Take 150 mcg by mouth daily before breakfast.     lisinopril (ZESTRIL) 30 MG tablet Take 30 mg by mouth at bedtime.     meclizine (ANTIVERT) 25 MG tablet Take 25 mg by mouth 3 (three) times daily as needed for dizziness.     mirabegron ER (MYRBETRIQ) 50 MG TB24 tablet Take 50 mg by mouth daily.      nitroGLYCERIN (NITROSTAT) 0.4 MG SL tablet Place 1 tablet (0.4 mg total) under the tongue every 5 (five) minutes as needed for chest pain. 25 tablet 5   traZODone (DESYREL) 100 MG tablet Take 100 mg by mouth at bedtime.      REVIEW OF SYSTEMS:  '[X]'$  denotes positive finding, '[ ]'$  denotes negative finding Cardiac  Comments:  Chest pain or chest pressure:    Shortness of breath upon exertion:    Short of breath when lying flat:    Irregular heart rhythm:        Vascular    Pain in calf, thigh, or  hip brought on by ambulation:    Pain in feet at night that wakes you up from your sleep:     Blood clot in your veins:    Leg swelling:         Pulmonary    Oxygen at home:    Productive cough:     Wheezing:         Neurologic    Sudden weakness in arms or legs:     Sudden numbness in arms or legs:     Sudden onset of difficulty speaking or slurred speech:    Temporary loss of vision in one eye:     Problems with dizziness:         Gastrointestinal    Blood in stool:     Vomited blood:         Genitourinary    Burning when urinating:     Blood in urine:        Psychiatric    Major depression:         Hematologic    Bleeding problems:    Problems with blood clotting too easily:        Skin    Rashes or ulcers:        Constitutional    Fever or chills:    -  PHYSICAL EXAM:   Vitals:   02/28/21 1845 02/28/21 1945 02/28/21 2100 02/28/21 2130  BP: 124/67 122/63 124/62 118/62  Pulse: 75 67 67 66  Resp: (!) '26 18 15 '$ (!) 24  Temp:      TempSrc:      SpO2: 94% 95% 93% 94%   There is no height or weight on file to calculate BMI. GENERAL: The patient is a well-nourished male, in no acute distress. The vital signs are documented above. CARDIAC: There is a regular rate and rhythm.  VASCULAR: I do not detect carotid bruits. He has palpable femoral, popliteal, dorsalis pedis, and posterior tibial pulses bilaterally. He has no significant lower extremity swelling. PULMONARY:  There is good air exchange bilaterally without wheezing or rales. ABDOMEN: Soft and non-tender with normal pitched bowel sounds.  His aneurysm is palpable and nontender. MUSCULOSKELETAL: There are no major deformities. NEUROLOGIC: No focal weakness or paresthesias are detected. SKIN: There are no ulcers or rashes noted. PSYCHIATRIC: The patient has a normal affect.  DATA:    CT ANGIOGRAM CHEST ABDOMEN PELVIS: I have reviewed the images of his CT angiogram of the chest abdomen and pelvis.  He does not have a dissection.  He has some enlargement of the aortic arch.  The distal arch measures 4.2 cm in maximum diameter.  There is a more prominent thoracic aneurysm in the mid thoracic aorta which measures 5.2 cm and is associated with mural thrombus.  He has an infrarenal abdominal aortic aneurysm which measures 4.4 cm in maximum diameter.  This extends up to the level of the renal arteries..  He also has stenosis of the celiac axis SMA and IMA.  In addition he has renal artery stenoses.  Deitra Mayo [facility]

## 2021-02-28 NOTE — ED Notes (Signed)
Pt provided OJ to drink

## 2021-02-28 NOTE — H&P (Signed)
History and Physical    Keith Watson. [id number] DOB: 14-Aug-1933 DOA: 02/28/2021  PCP: Nicoletta Dress, MD   Patient coming from: Home  Chief Complaint: Feeling like "jumping and jittery"  HPI: Keith Watson. is a 87 y.o. male with medical history significant for CAD, HTN, HLD, PVD who presents by EMS for evaluation of feeling "jittery and jumpy". He reports when he woke up this morning he states he felt very shaky and jittery his body.  He dates he did not have any chest pain or pressure and did not feel any shortness of breath.  He went to see his PCP and was sent to the emergency room out of concern that "there may be some EKG changes" when checked by his PCP.  He reports he has not had any dizziness or lightheadedness and he has not had any syncope.  Ports he was feeling in his normal state of health until he woke up this morning feeling jittery.  He had multiple bypass surgeries of his coronary arteries 15 years ago and at that time he had presented with feeling jittery but also having severe hiccups for hours.  He is followed by Dr. Bettina Gavia for cardiology.  He states he is taking all his medications as prescribed although he did not take his morning atenolol dose secondary to not feeling well.  Denies any fever or chills and he has no recent sick contacts. Home with his wife.  He has a history of 2-pack-year history of smoking but he quit 45 years ago.  He is reports he drinks 2 shots of bourbon a day but he has never had withdrawals.  He denies illicit drug use  ED Course: Keith Watson had work-up for aortic dissection in the emergency room.  CT angiography of his chest abdomen pelvis was negative for aortic dissection.  He was noted to have aortic aneurysm in the thoracic and abdominal region.  He also had atherosclerotic disease in the abdominal arteries causing 50 to 90% occlusions.  He has no abdominal pain or signs of ischemic bowel.  Lab work showed an unremarkable  CMP and CBC.  Initial troponin was 15 but increased to 20 when rechecked a few hours later.  Patient had no ischemic EKG changes and no chest pain/pressure or shortness of breath.  COVID-19 swab was negative.  Influenza a and B swab are negative.  Urinalysis negative.  Patient was seen by cardiology in the emergency room who did not feel patient was having acute coronary syndrome but with diffuse atherosclerosis and enlarged aorta that was previously not known per the patient ER physician asked the hospital service to observe patient overnight and await vascular surgery and cardiothoracic surgery evaluation in the morning  Review of Systems:  General: Denies fever, chills, weight loss, night sweats.  Denies dizziness.  Denies change in appetite HENT: Denies head trauma, headache, denies change in hearing, tinnitus.  Denies nasal congestion.  Denies sore throat.  Denies difficulty swallowing Eyes: Denies blurry vision, pain in eye, drainage.  Denies discoloration of eyes. Neck: Denies pain.  Denies swelling.  Denies pain with movement. Cardiovascular: Denies chest pain, palpitations.  Denies orthopnea Respiratory: Denies shortness of breath, cough. Denies wheezing.  Denies sputum production Gastrointestinal: Denies abdominal pain, swelling. Denies nausea, vomiting, diarrhea.  Denies melena.  Denies hematemesis. Musculoskeletal: Denies limitation of movement.  Denies deformity or swelling.  Denies pain.  Denies arthralgias or myalgias. Genitourinary: Denies pelvic pain.  Denies urinary frequency or hesitancy.  Denies dysuria.  Skin: Denies rash. Denies petechiae, purpura, ecchymosis. Neurological: Denies syncope. Denies seizure activity. Denies paresthesia. Denies slurred speech, drooping face.  Denies visual change. Psychiatric: Denies depression, anxiety.  Denies hallucinations.  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 His smoking use included cigarettes. He has a 10.00 pack-year smoking history. He has never used smokeless tobacco. He reports current alcohol use of about 1.0 - 2.0 standard drink of alcohol per week. He reports that he does not use drugs.  No Known Allergies  Family History  Problem Relation Age of Onset   Heart attack Mother    Hypertension Mother    CAD Mother    Heart disease Father    Heart attack Father      Prior to Admission medications   Medication Sig Start Date End Date Taking? Authorizing Provider  aspirin EC 81 MG tablet Take 81 mg by mouth daily.   Yes [provider]  atenolol (TENORMIN) 100 MG tablet Take 100 mg by mouth daily. 09/24/18  Yes [provider]  atorvastatin (LIPITOR) 80 MG tablet Take 80 mg by mouth at bedtime. 07/21/18  Yes [provider]  clopidogrel (PLAVIX) 75 MG tablet Take 75 mg by mouth daily.   Yes [provider]  cyanocobalamin (,VITAMIN B-12,) 1000 MCG/ML injection Inject 1,000 mcg into the muscle every 30 (thirty) days.   Yes [provider]  ezetimibe (ZETIA) 10 MG tablet Take 10 mg by mouth daily.   Yes [provider]  levothyroxine  (SYNTHROID) 150 MCG tablet Take 150 mcg by mouth daily before breakfast.   Yes [provider]  lisinopril (ZESTRIL) 30 MG tablet Take 30 mg by mouth at bedtime. 09/18/20  Yes [provider]  meclizine (ANTIVERT) 25 MG tablet Take 25 mg by mouth 3 (three) times daily as needed for dizziness. 10/31/20  Yes [provider]  mirabegron ER (MYRBETRIQ) 50 MG TB24 tablet Take 50 mg by mouth daily.   Yes [provider]  nitroGLYCERIN (NITROSTAT) 0.4 MG SL tablet Place 1 tablet (0.4 mg total) under the tongue every 5 (five) minutes as needed for chest pain. 06/01/19 04/11/21 Yes Richardo Priest, MD  traZODone (DESYREL) 100 MG tablet Take 100 mg by mouth at bedtime.   Yes [provider]    Physical Exam: Vitals:   02/28/21 1715 02/28/21 1730 02/28/21 1845 02/28/21 1945  BP: (!) 184/129 (!) 176/91 124/67 122/63  Pulse: 69 63 75 67  Resp: (!) 22 20 (!) 26  18  Temp:      TempSrc:      SpO2: 95% 95% 94% 95%    Constitutional: NAD, calm, comfortable Vitals:   02/28/21 1715 02/28/21 1730 02/28/21 1845 02/28/21 1945  BP: (!) 184/129 (!) 176/91 124/67 122/63  Pulse: 69 63 75 67  Resp: (!) 22 20 (!) 26 18  Temp:      TempSrc:      SpO2: 95% 95% 94% 95%   General: WDWN, Alert and oriented x3.  Eyes: EOMI, PERRL, conjunctivae normal.  Sclera nonicteric HENT:  [place]/AT, external ears normal.  Nares patent without epistasis.  Mucous membranes are moist.  Neck: Soft, normal range of motion, supple, no masses, Trachea midline Respiratory: clear to auscultation bilaterally, no wheezing, no crackles. Normal respiratory effort. No accessory muscle use.  Cardiovascular: Regular rate and rhythm, no murmurs / rubs / gallops. Mild lower extremity edema. 1+ pedal pulses. Healed incision of sternal midline. Abdomen: Soft, no tenderness, nondistended, no rebound or guarding.  No masses palpated. Bowel sounds normoactive Musculoskeletal: FROM. no cyanosis. Normal muscle  tone.  Skin: Warm, dry, intact no rashes, lesions, ulcers. No induration Neurologic: CN 2-12 grossly intact.  Normal speech. Strength 5/5 in all extremities.   Psychiatric: Normal judgment and insight.  Normal mood.    Labs on Admission: I have personally reviewed following labs and imaging studies  CBC: Recent Labs  Lab 02/28/21 1038  WBC 6.9  NEUTROABS 5.1  HGB 16.0  HCT 51.3  MCV 99.4  PLT [id number]    Basic Metabolic Panel: Recent Labs  Lab 02/28/21 1038  NA 138  K 4.2  CL 102  CO2 20*  GLUCOSE 69*  BUN 11  CREATININE 1.17  CALCIUM 9.1    GFR: CrCl cannot be calculated (Unknown ideal weight.).  Liver Function Tests: Recent Labs  Lab 02/28/21 1038  AST 37  ALT 22  ALKPHOS 81  BILITOT 1.1  PROT 6.9  ALBUMIN 3.8    Urine analysis:    Component Value Date/Time   COLORURINE YELLOW 02/28/2021 1404   APPEARANCEUR CLEAR 02/28/2021 1404   LABSPEC 1.036 (H) 02/28/2021 1404   PHURINE 6.0 02/28/2021 1404   GLUCOSEU NEGATIVE 02/28/2021 1404   HGBUR NEGATIVE 02/28/2021 1404   BILIRUBINUR NEGATIVE 02/28/2021 1404   KETONESUR 20 (A) 02/28/2021 1404   PROTEINUR NEGATIVE 02/28/2021 1404   NITRITE NEGATIVE 02/28/2021 1404   LEUKOCYTESUR NEGATIVE 02/28/2021 1404    Radiological Exams on Admission: DG Chest 1 View  Result Date: 02/28/2021 CLINICAL DATA:  Patient woke up feeling anxious and shaky this morning. No pain. EKG changes. EXAM: CHEST  1 VIEW COMPARISON:  June 19, 2010 FINDINGS: There is a convexity to the descending thoracic aorta not seen on the comparison study. The remainder of the thoracic aorta is tortuous but unchanged. The hila and mediastinum are unremarkable. The heart size is normal. No pneumothorax. No suspicious nodules or masses. No focal infiltrates. IMPRESSION: 1. There is a new convexity to the descending thoracic aorta raising the possibility of aneurysm/aortic pathology. Recommend a CT angiogram of the chest to better evaluate the aorta. 2.  No other abnormalities. Electronically Signed   By: Dorise Bullion III M.D   On: 02/28/2021 11:24   CT Angio Chest/Abd/Pel for Dissection W and/or Wo Contrast  Result Date: 02/28/2021 CLINICAL DATA:  Epigastric abdominal pain. New convexity of the descending thoracic aorta on a chest radiograph today. Clinical concern for aortic dissection. EXAM: CT ANGIOGRAPHY CHEST, ABDOMEN AND PELVIS TECHNIQUE: Non-contrast  CT of the chest was initially obtained. Multidetector CT imaging through the chest, abdomen and pelvis was performed using the standard protocol during bolus administration of intravenous contrast. Multiplanar reconstructed images and MIPs were obtained and reviewed to evaluate the vascular anatomy. CONTRAST:  142m OMNIPAQUE IOHEXOL 350 MG/ML SOLN COMPARISON:  Chest radiographs obtained earlier today and on 06/19/2010. Chest CT dated 12/15/2009. FINDINGS: CTA CHEST FINDINGS Cardiovascular: Focal aneurysmal dilatation of the mid descending thoracic aorta with a maximum diameter of 5.2 cm on image number 85/5. This contains mural thrombus. Mild aneurysmal dilatation of the aortic arch, measuring 4.2 cm in maximum diameter on image number 44/5. The ascending thoracic aorta and distal descending thoracic aorta are normal in caliber. No dissection seen. Mildly enlarged heart. Atheromatous calcifications, including the coronary arteries and aorta. Post CABG changes. Mediastinum/Nodes: No enlarged mediastinal, hilar, or axillary lymph nodes. Thyroid gland, trachea, and esophagus demonstrate no significant findings. Moderate-sized hiatal hernia. Lungs/Pleura: Interval multiple small nodular densities in the inferior right lower lobe. These include a 3 mm nodule on image number 97/9 in multiple adjacent nodules in the as ago esophageal recess the medial right lung base, the largest measuring 9 mm on image number 118/9. No nodules elsewhere in either lung. No pleural fluid. Mild left lower lobe paraseptal bullous  emphysema. Musculoskeletal: Thoracic and lower cervical spine degenerative changes. Median sternotomy wires. Review of the MIP images confirms the above findings. CTA ABDOMEN AND PELVIS FINDINGS VASCULAR Aorta: Aneurysmal dilatation of the proximal infrarenal abdominal aorta and distal infrarenal abdominal aorta with mural thrombus at both locations. Proximally, this measures 4.4 cm in maximum diameter on image 192/5. Distally, this measures 4.1 cm in maximum diameter on image number 218/5. No dissection seen. Celiac: Calcified and noncalcified plaque at the origin of the celiac axis causing approximately 90% stenosis with normal opacification distally. SMA: Calcified plaque at the origin causing approximately 60% stenosis. Normal opacification distally. Renals: Plaque at the origin of the left renal artery producing approximately 60% stenosis. Two right renal arteries. Plaque formation at the origin of the more superior right renal artery producing approximately 90% stenosis. Plaque formation at the origin of the more inferior artery causing approximately 70% stenosis. IMA: Plaque formation at the origin producing approximately 80% stenosis. Inflow: Minimal fusiform aneurysmal dilatation of the left common iliac artery with a maximum diameter of 1.7 cm. Focal aneurysmal dilatation of the distal right common iliac artery with a maximum diameter of 2.0 cm. Proximal left internal iliac artery aneurysm with extensive mural thrombus, measuring 4.1 cm in maximum diameter on image number 250/5. There is a 2nd area of focal aneurysmal dilatation of the distal left internal iliac artery with extensive mural thrombus, measuring 3.8 cm in maximum diameter on image number 27/5. The lumen is patent through these areas. Additional atheromatous changes involving the rest of the iliac arteries bilaterally and proximal femoral arteries without significant stenosis. Veins: No obvious venous abnormality within the limitations of this  arterial phase study. Review of the MIP images confirms the above findings. NON-VASCULAR Hepatobiliary: Mild diffuse low density of the liver relative to the spleen. Normal appearing gallbladder. Pancreas: Moderate diffuse pancreatic atrophy. Spleen: Normal in size without focal abnormality. Adrenals/Urinary Tract: Normal appearing adrenal glands. Upper pole right renal cyst. Normal appearing left kidney. Unremarkable bladder and ureters. Stomach/Bowel: Moderate-sized hiatal hernia. Multiple colonic diverticula without evidence of diverticulitis. No evidence of appendicitis. Unremarkable small bowel. Lymphatic: No enlarged lymph nodes. Reproductive: Mildly to moderately enlarged prostate gland. Other: Small umbilical  hernia containing fat. Musculoskeletal: Lumbar spine degenerative changes including facet degenerative changes with associated grade 1 anterolisthesis at the L4-5 level. No fractures or pars defects. Review of the MIP images confirms the above findings. IMPRESSION: 1. No aortic dissection or other acute abnormality. 2. Interval aneurysmal dilatation of the aortic arch and proximal and mid descending thoracic aorta, most pronounced at the level of the midthoracic aorta with a maximum diameter of 5.2 cm. The distal arch measures 4.2 cm in maximum diameter. Recommend semi-annual imaging followup by CTA or MRA and referral to cardiothoracic surgery if not already obtained. This recommendation follows 2010 ACCF/AHA/AATS/ACR/ASA/SCA/SCAI/SIR/STS/SVM Guidelines for the Diagnosis and Management of Patients With Thoracic Aortic Disease. Circulation. 2010; [id number]:[id number]. Aortic aneurysm NOS (ICD10-I71.9) 3. Focal aneurysmal dilatation of the proximal and distal portions of the infrarenal abdominal aorta with a maximum diameter of 4.4 cm proximally and 4.1 cm distally. Recommend follow-up every 12 months and vascular consultation. This recommendation follows ACR consensus guidelines: White Paper of the ACR  Incidental Findings Committee II on Vascular Findings. J Am Coll Radiol 2013; 10:789-794. 4. 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 5. Plaque formation producing approximately 90% stenosis of the origin of the celiac axis, 60% stenosis of the origin of the superior mesenteric artery, 60% stenosis of the origin of the left renal artery, 90% stenosis of the origin of the more cephalad right renal artery, 70% stenosis of the origin of the more caudal right renal artery and 80% stenosis of the origin of the inferior mesenteric artery. 6.  Calcific coronary artery and aortic atherosclerosis. 7. Mild cardiomegaly. 8. Moderate-sized hiatal hernia. 9. Interval multiple nodules in the right lower lobe. Non-contrast chest CT at 3-6 months is recommended. If the nodules are stable at time of repeat CT, then future CT at 18-24 months (from today's scan) is considered optional for low-risk patients, but is recommended for high-risk patients. This recommendation follows the consensus statement: Guidelines for Management of Incidental Pulmonary Nodules Detected on CT Images: From the Fleischner Society 2017; Radiology 2017; 284:228-243. 10. Mild paraseptal bullous emphysema. 11. Mild hepatic steatosis. Electronically Signed   By: Claudie Revering M.D.   On: 02/28/2021 15:33    EKG: Independently reviewed. EKG shows normal sinus rhythm with no acute ST elevation or depression.  Right bundle branch block noted.  QTc 474  Assessment/Plan Principal Problem:   Aortic aneurysm  Mr. Brazzel is placed on cardiac telemetry for observation.  Pt with diffuse aortic aneurysms and atherosclerosis. Vascular and CT surgery consulted by ER physician and will see tomorrow.  Pt was seen by Cardiology in the ER and no sign of acute cardiac ischemia.  No dissection of aorta on CTA emergency room  Active  Problems:   Atherosclerosis of arteries Will check serial troponin levels as initial level was mildly elevated at 20. No chest pain per patient.  Continue aspirin and Plavix that patient takes at home.    Hypertension Medication of lisinopril, atenolol.  Monitor blood pressure    Hyperlipidemia Continue Lipitor and Zetia.    PVD (peripheral vascular disease)  Continue current regimen.  Vascular surgery to evaluate as patient has multiple stenotic areas of abdominal mesenteric arteries    DVT prophylaxis: Lovenox for DVT prophlyaxis.  Code Status:   Full Code  Family Communication:  Diagnosis and plan discussed with patient.  He verbalized understanding agrees with  plan.  Questions answered.  Further recommendations to follow as clinical indicated Disposition Plan:   Patient is from:  Home  Anticipated DC to:  Home  Anticipated DC date:  Anticipate less than 2 midnight stay in the hospital  Consults called:  Cardiology, Vascular surgery consulted by ER physician  Admission status:  Observation   Yevonne Aline Lenzi Marmo MD [facility]  How to contact the [facility] Attending or Consulting provider [place] or covering provider during after hours [place], for this patient?   Check the care team in [facility] and look for a) attending/consulting [facility] provider listed and b) the [facility] team listed Log into [email or web address] and use [place]'s universal password to access. If you do not have the password, please contact the hospital operator. Locate the [facility] provider you are looking for under [facility] and page to a number that you can be directly reached. If you still have difficulty reaching the provider, please page the [facility] (Director on Call) for the Hospitalists listed on amion for assistance.  02/28/2021, 8:57 PM

## 2021-02-28 NOTE — ED Triage Notes (Addendum)
Pt to triage via [facility] from PCP office.  Pt woke up feeling anxious and shaky this morning.  Went to PCP office and EMS called for EKG changes.  Pt denies SOB and chest pain.  18g LAC.  4 baby ASA given PTA.

## 2021-02-28 NOTE — ED Provider Notes (Signed)
Emergency Medicine Provider Triage Evaluation Note  Keith Watson. , a 87 y.o. male  was evaluated in triage after being brought in by ambulance from the primary care office due to EKG changes.  He woke up this morning feeling extra shaky and went to his primary care who saw that he had EKG changes.  Pt with a history of a CABG 15 years ago and reports that the shaky feeling was the only symptom before his prior MI.  Review of Systems  Positive: Anxiety Negative: CP, pressure. Palpitations, SOB or leg swelling.  Physical Exam  BP (!) 160/116 (BP Location: Right Arm)   Pulse 84   Resp 16   SpO2 94%  Gen:   Awake, no distress. Shaking and reports being cold. Resp:  Normal effort, ctab MSK:   Moves extremities without difficulty   Medical Decision Making  Medically screening exam initiated at 10:33 AM.  Appropriate orders placed.  Keith Nicole [facility]et. was informed that the remainder of the evaluation will be completed by another provider, this initial triage assessment does not replace that evaluation, and the importance of remaining in the ED until their evaluation is complete.   Keith Hammock, PA-C 02/28/21 1037    Keith Saver, MD 02/28/21 1304

## 2021-02-28 NOTE — ED Provider Notes (Signed)
[facility] EMERGENCY DEPARTMENT Provider Note   CSN: [id number] Arrival date & time: 02/28/21  1024     History CC:  jittery   Keith Eyad Ahlers. is a 87 y.o. male w/ hx of HTN, HLD, CAD s/p bypass (2011), presenting to emergency department with sensation of jitteriness.  The patient reports that he woke up this morning feeling "shaky and jittery, just like I did when I had my bypass 15 years ago".  He went to see his PCP.  There was concern at the office that there were "maybe some EKG changes".  EMS was called to bring him into the ED.  The patient received full dose aspirin by EMS.  He denies receiving nitroglycerin.  Here in the emerge apartment he continues to feel "jittery".  He denies that he has chest pain or pressure, he denies shortness of breath, denies lightheadedness or dizziness.  He has no other symptoms.  He says he was feeling well this past week.  He has not had any chest pain or exertional symptoms recently.  His cardiologist is Dr Bettina Gavia.  He does normally take 100 mg atenolol in the morning for high blood pressure and did not take it this morning.  He is normally on baby aspirin and Plavix, but does not take any other blood thinners.  He takes a statin and zetia for high cholesterol.  He denies history of diabetes or smoking.  He denies any personal family history of aneurysms.  Patient last seen by Dr Bettina Gavia on 02/10/21.  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HCC)    Chronic GERD    Controlled insomnia    Elevated fasting blood sugar    Erectile disorder due to medical condition in male    Hyperlipidemia 01/08/2019   Hypertension 01/08/2019   Multi-vessel coronary artery stenosis    Olecranon bursitis of left elbow    PVD (peripheral vascular  disease) ([place])    Thyroid disease 01/08/2019   TIA (transient ischemic attack)     Patient Active Problem List   Diagnosis Date Noted   Aortic aneurysm ([place]) 02/28/2021   Atherosclerosis of arteries 02/28/2021   Abnormal EKG    Weight loss 12/18/2020   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[place]) 12/18/2020   PVD (peripheral vascular disease) (HCC)    Olecranon bursitis of left elbow    Multi-vessel coronary artery stenosis    Erectile disorder due to medical condition in male    Elevated fasting blood sugar    Controlled insomnia    Chronic GERD    Adult hypothyroidism    Hypertension 01/08/2019   Hyperlipidemia 01/08/2019   Thyroid disease 01/08/2019   Cancer ([place]) 01/08/2019   CAD (coronary artery disease) 01/08/2019   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   Gait disturbance, 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    Past Surgical History:  Procedure Laterality Date   CORONARY ARTERY BYPASS GRAFT  2011   ELBOW SURGERY  10/2017   Bursitis   SALIVARY GLAND SURGERY  2016   Removal due to cancer   TONSILLECTOMY  1957       Family History  Problem Relation Age of Onset   Heart attack Mother    Hypertension Mother    CAD Mother    Heart  disease Father    Heart attack Father     Social History   Tobacco Use   Smoking status: Former    Packs/day: 1.00    Years: 10.00    Pack years: 10.00    Types: Cigarettes    Quit date: 1970    Years since quitting: 52.6   Smokeless tobacco: Never   Tobacco comments:    quit 45 years  Vaping Use   Vaping Use: Never used  Substance Use Topics   Alcohol use: Yes    Alcohol/week: 1.0 - 2.0 standard drink    Types: 1 - 2 Standard drinks or equivalent per week    Comment: 1-2 cocktails daily   Drug use: Never    Home Medications Prior to Admission medications   Medication Sig Start Date End Date Taking? Authorizing Provider  aspirin EC 81 MG tablet Take 81 mg by mouth daily.   Yes [provider]  atenolol (TENORMIN) 100 MG tablet Take  100 mg by mouth daily. 09/24/18  Yes [provider]  atorvastatin (LIPITOR) 80 MG tablet Take 80 mg by mouth at bedtime. 07/21/18  Yes [provider]  clopidogrel (PLAVIX) 75 MG tablet Take 75 mg by mouth daily.   Yes [provider]  cyanocobalamin (,VITAMIN B-12,) 1000 MCG/ML injection Inject 1,000 mcg into the muscle every 30 (thirty) days.   Yes [provider]  ezetimibe (ZETIA) 10 MG tablet Take 10 mg by mouth daily.   Yes [provider]  levothyroxine (SYNTHROID) 150 MCG tablet Take 150 mcg by mouth daily before breakfast.   Yes [provider]  lisinopril (ZESTRIL) 30 MG tablet Take 30 mg by mouth at bedtime. 09/18/20  Yes [provider]  meclizine (ANTIVERT) 25 MG tablet Take 25 mg by mouth 3 (three) times daily as needed for dizziness. 10/31/20  Yes [provider]  mirabegron ER (MYRBETRIQ) 50 MG TB24 tablet Take 50 mg by mouth daily.   Yes [provider]  nitroGLYCERIN (NITROSTAT) 0.4 MG SL tablet Place 1 tablet (0.4 mg total) under the tongue every 5 (five) minutes as needed for chest pain. 06/01/19 04/11/21 Yes Richardo Priest, MD  traZODone (DESYREL) 100 MG tablet Take 100 mg by mouth at bedtime.   Yes [provider]    Allergies    Patient has no known allergies.  Review of Systems   Review of Systems  Constitutional:  Negative for chills and fever.  Eyes:  Negative for pain and visual disturbance.  Respiratory:  Negative for cough and shortness of breath.   Cardiovascular:  Negative for chest pain and palpitations.  Gastrointestinal:  Negative for abdominal pain, nausea and vomiting.  Genitourinary:  Negative for dysuria and hematuria.  Musculoskeletal:  Negative for arthralgias and back pain.  Skin:  Negative for color change and rash.  Neurological:  Negative for syncope, light-headedness, numbness and headaches.  All other systems reviewed and are negative.  Physical  Exam Updated Vital Signs BP 138/68   Pulse 62   Temp 98.5 F (36.9 C) (Oral)   Resp 16   Ht '5\' 10"'$  (1.778 m)   Wt 70.7 kg   SpO2 95%   BMI 22.37 kg/m   Physical Exam Constitutional:      General: He is not in acute distress. HENT:     Head: Normocephalic and atraumatic.  Eyes:     Conjunctiva/sclera: Conjunctivae normal.     Pupils: Pupils are equal, round, and reactive to light.  Cardiovascular:     Rate and Rhythm: Normal rate and regular rhythm.  Pulmonary:     Effort: Pulmonary effort is normal. No respiratory distress.  Abdominal:     General: There is no distension.     Tenderness: There is no abdominal tenderness.  Skin:    General: Skin is warm and dry.  Neurological:     General: No focal deficit present.     Mental Status: He is alert and oriented to person, place, and time. Mental status is at baseline.  Psychiatric:        Mood and Affect: Mood normal.        Behavior: Behavior normal.    ED Results / Procedures / Treatments   Labs (all labs ordered are listed, but only abnormal results are displayed) Labs Reviewed  COMPREHENSIVE METABOLIC PANEL - Abnormal; Notable for the following components:      Result Value   CO2 20 (*)    Glucose, Bld 69 (*)    Anion gap 16 (*)    All other components within normal limits  URINALYSIS, ROUTINE W REFLEX MICROSCOPIC - Abnormal; Notable for the following components:   Specific Gravity, Urine 1.036 (*)    Ketones, ur 20 (*)    All other components within normal limits  CBG MONITORING, ED - Abnormal; Notable for the following components:   Glucose-Capillary 176 (*)    All other components within normal limits  TROPONIN I (HIGH SENSITIVITY) - Abnormal; Notable for the following components:   Troponin I (High Sensitivity) 20 (*)    All other components within normal limits  RESP PANEL BY RT-PCR (FLU A&B, COVID) ARPGX2  CBC WITH DIFFERENTIAL/PLATELET  TSH  HEMOGLOBIN A1C  LIPID PANEL  BASIC METABOLIC PANEL   TROPONIN I (HIGH SENSITIVITY)  TROPONIN I (HIGH SENSITIVITY)    EKG EKG Interpretation  Date/Time:  Saturday February 28 2021 10:33:30 EDT Ventricular Rate:  82 PR Interval:  184 QRS Duration: 128 QT Interval:  406 QTC Calculation: 474 R Axis:   0 Text Interpretation: Normal sinus rhythm Right bundle branch block No prior ecg for comparison Confirmed by Octaviano Glow [id number]) on 02/28/2021 12:00:54 PM  Radiology DG Chest 1 View  Result Date: 02/28/2021 CLINICAL DATA:  Patient woke up feeling anxious and shaky this morning. No pain. EKG changes. EXAM: CHEST  1 VIEW COMPARISON:  June 19, 2010 FINDINGS: There is a convexity to the descending thoracic aorta not seen on the comparison study. The remainder of the thoracic aorta is tortuous but unchanged. The hila and mediastinum are unremarkable. The heart size is normal. No pneumothorax. No suspicious nodules or masses. No focal infiltrates. IMPRESSION: 1. There is a new convexity to the descending thoracic aorta raising the possibility of aneurysm/aortic pathology. Recommend a CT angiogram of the chest to better evaluate the aorta. 2. No other abnormalities. Electronically Signed   By: Dorise Bullion III M.D   On: 02/28/2021 11:24   CT Angio Chest/Abd/Pel for Dissection W and/or Wo Contrast  Result Date: 02/28/2021 CLINICAL DATA:  Epigastric abdominal pain. New convexity of the descending thoracic aorta on a chest radiograph today. Clinical concern for aortic dissection. EXAM: CT ANGIOGRAPHY CHEST, ABDOMEN AND PELVIS TECHNIQUE: Non-contrast CT of the chest was initially obtained. Multidetector CT imaging through the chest, abdomen and pelvis was performed using the standard protocol during bolus administration of intravenous contrast. Multiplanar reconstructed images and MIPs were obtained and reviewed to evaluate the vascular anatomy. CONTRAST:  164m OMNIPAQUE IOHEXOL 350  MG/ML SOLN COMPARISON:  Chest radiographs obtained earlier today and on  06/19/2010. Chest CT dated 12/15/2009. FINDINGS: CTA CHEST FINDINGS Cardiovascular: Focal aneurysmal dilatation of the mid descending thoracic aorta with a maximum diameter of 5.2 cm on image number 85/5. This contains mural thrombus. Mild aneurysmal dilatation of the aortic arch, measuring 4.2 cm in maximum diameter on image number 44/5. The ascending thoracic aorta and distal descending thoracic aorta are normal in caliber. No dissection seen. Mildly enlarged heart. Atheromatous calcifications, including the coronary arteries and aorta. Post CABG changes. Mediastinum/Nodes: No enlarged mediastinal, hilar, or axillary lymph nodes. Thyroid gland, trachea, and esophagus demonstrate no significant findings. Moderate-sized hiatal hernia. Lungs/Pleura: Interval multiple small nodular densities in the inferior right lower lobe. These include a 3 mm nodule on image number 97/9 in multiple adjacent nodules in the as ago esophageal recess the medial right lung base, the largest measuring 9 mm on image number 118/9. No nodules elsewhere in either lung. No pleural fluid. Mild left lower lobe paraseptal bullous emphysema. Musculoskeletal: Thoracic and lower cervical spine degenerative changes. Median sternotomy wires. Review of the MIP images confirms the above findings. CTA ABDOMEN AND PELVIS FINDINGS VASCULAR Aorta: Aneurysmal dilatation of the proximal infrarenal abdominal aorta and distal infrarenal abdominal aorta with mural thrombus at both locations. Proximally, this measures 4.4 cm in maximum diameter on image 192/5. Distally, this measures 4.1 cm in maximum diameter on image number 218/5. No dissection seen. Celiac: Calcified and noncalcified plaque at the origin of the celiac axis causing approximately 90% stenosis with normal opacification distally. SMA: Calcified plaque at the origin causing approximately 60% stenosis. Normal opacification distally. Renals: Plaque at the origin of the left renal artery producing  approximately 60% stenosis. Two right renal arteries. Plaque formation at the origin of the more superior right renal artery producing approximately 90% stenosis. Plaque formation at the origin of the more inferior artery causing approximately 70% stenosis. IMA: Plaque formation at the origin producing approximately 80% stenosis. Inflow: Minimal fusiform aneurysmal dilatation of the left common iliac artery with a maximum diameter of 1.7 cm. Focal aneurysmal dilatation of the distal right common iliac artery with a maximum diameter of 2.0 cm. Proximal left internal iliac artery aneurysm with extensive mural thrombus, measuring 4.1 cm in maximum diameter on image number 250/5. There is a 2nd area of focal aneurysmal dilatation of the distal left internal iliac artery with extensive mural thrombus, measuring 3.8 cm in maximum diameter on image number 27/5. The lumen is patent through these areas. Additional atheromatous changes involving the rest of the iliac arteries bilaterally and proximal femoral arteries without significant stenosis. Veins: No obvious venous abnormality within the limitations of this arterial phase study. Review of the MIP images confirms the above findings. NON-VASCULAR Hepatobiliary: Mild diffuse low density of the liver relative to the spleen. Normal appearing gallbladder. Pancreas: Moderate diffuse pancreatic atrophy. Spleen: Normal in size without focal abnormality. Adrenals/Urinary Tract: Normal appearing adrenal glands. Upper pole right renal cyst. Normal appearing left kidney. Unremarkable bladder and ureters. Stomach/Bowel: Moderate-sized hiatal hernia. Multiple colonic diverticula without evidence of diverticulitis. No evidence of appendicitis. Unremarkable small bowel. Lymphatic: No enlarged lymph nodes. Reproductive: Mildly to moderately enlarged prostate gland. Other: Small umbilical hernia containing fat. Musculoskeletal: Lumbar spine degenerative changes including facet  degenerative changes with associated grade 1 anterolisthesis at the L4-5 level. No fractures or pars defects. Review of the MIP images confirms the above findings. IMPRESSION: 1. No aortic dissection or other acute abnormality. 2. Interval aneurysmal  dilatation of the aortic arch and proximal and mid descending thoracic aorta, most pronounced at the level of the midthoracic aorta with a maximum diameter of 5.2 cm. The distal arch measures 4.2 cm in maximum diameter. Recommend semi-annual imaging followup by CTA or MRA and referral to cardiothoracic surgery if not already obtained. This recommendation follows 2010 ACCF/AHA/AATS/ACR/ASA/SCA/SCAI/SIR/STS/SVM Guidelines for the Diagnosis and Management of Patients With Thoracic Aortic Disease. Circulation. 2010; 121GL:6099015. Aortic aneurysm NOS (ICD10-I71.9) 3. Focal aneurysmal dilatation of the proximal and distal portions of the infrarenal abdominal aorta with a maximum diameter of 4.4 cm proximally and 4.1 cm distally. Recommend follow-up every 12 months and vascular consultation. This recommendation follows ACR consensus guidelines: White Paper of the ACR Incidental Findings Committee II on Vascular Findings. J Am Coll Radiol 2013; 10:789-794. 4. 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 5. Plaque formation producing approximately 90% stenosis of the origin of the celiac axis, 60% stenosis of the origin of the superior mesenteric artery, 60% stenosis of the origin of the left renal artery, 90% stenosis of the origin of the more cephalad right renal artery, 70% stenosis of the origin of the more caudal right renal artery and 80% stenosis of the origin of the inferior mesenteric artery. 6.  Calcific coronary artery and aortic atherosclerosis. 7. Mild cardiomegaly. 8. Moderate-sized hiatal hernia. 9. Interval multiple nodules in  the right lower lobe. Non-contrast chest CT at 3-6 months is recommended. If the nodules are stable at time of repeat CT, then future CT at 18-24 months (from today's scan) is considered optional for low-risk patients, but is recommended for high-risk patients. This recommendation follows the consensus statement: Guidelines for Management of Incidental Pulmonary Nodules Detected on CT Images: From the Fleischner Society 2017; Radiology 2017; 284:228-243. 10. Mild paraseptal bullous emphysema. 11. Mild hepatic steatosis. Electronically Signed   By: Claudie Revering M.D.   On: 02/28/2021 15:33    Procedures Procedures   Medications Ordered in ED Medications  atenolol (TENORMIN) tablet 100 mg (100 mg Oral Given 02/28/21 1348)  aspirin EC tablet 81 mg (has no administration in time range)  atorvastatin (LIPITOR) tablet 80 mg (has no administration in time range)  ezetimibe (ZETIA) tablet 10 mg (has no administration in time range)  lisinopril (ZESTRIL) tablet 30 mg (has no administration in time range)  nitroGLYCERIN (NITROSTAT) SL tablet 0.4 mg (has no administration in time range)  traZODone (DESYREL) tablet 100 mg (has no administration in time range)  levothyroxine (SYNTHROID) tablet 150 mcg (has no administration in time range)  mirabegron ER (MYRBETRIQ) tablet 50 mg (has no administration in time range)  clopidogrel (PLAVIX) tablet 75 mg (has no administration in time range)  enoxaparin (LOVENOX) injection 40 mg (has no administration in time range)  sodium chloride flush (NS) 0.9 % injection 3 mL (has no administration in time range)  sodium chloride flush (NS) 0.9 % injection 3 mL (has no administration in time range)  0.9 %  sodium chloride infusion (has no administration in time range)  acetaminophen (TYLENOL) tablet 650 mg (has no administration in time range)    Or  acetaminophen (TYLENOL) suppository 650 mg (has no administration in time range)  iohexol (OMNIPAQUE) 350 MG/ML injection 100  mL (100 mLs Intravenous Contrast Given 02/28/21 1429)  lisinopril (ZESTRIL) tablet 20 mg (20 mg Oral Given 02/28/21 1733)  hydrALAZINE (APRESOLINE) injection 10  mg (10 mg Intravenous Given 02/28/21 1734)    ED Course  I have reviewed the triage vital signs and the nursing notes.  Pertinent labs & imaging results that were available during my care of the patient were reviewed by me and considered in my medical decision making (see chart for details).  Ddx includes atypical ACS vs infection vs dehydration vs AD vs PNA vs other   I reviewed the patient's EKG which does not appear significantly changed from his prior EKGs on record.  There are no acute ST elevations that meet STEMI criteria at this time.  He also has no active chest pain or pressure.  Trop 15 -> 20.  Cardiology consulted as noted below  His chest x-ray at intake was concerning for new convexity of the ascending thoracic aorta raising concern for possible aneurysm..  I have ordered an aortic dissection study.  He is hypertensive but did not take his blood pressure medication this morning (I will order his atenolol).  With persistent hypertension he was also given 1 dose of hydralazine.  His BP subsequently improved.  Glucose 69 on arrival.  Pt reports he did not eat breakfast this morning which was not usual for him.  He has no hx of symptomatic hypoglycemia and is not diabetic.  I think it's unlikely this glucose level caused these symptoms.  He was given juice and food with improvement of glucose.  *  CT imaging reviewed - see ED course below.  Although aneurysm appears to be a new diagnosis (the radiologist reports no records in our system for recent imaging), it may be chronic in nature.  No evidence of acute dissection or vascular occlusion at this time to initiate heparin or A/C.  Workup and plan for admission discussed with patient and his son at bedside who are in agreement.   Clinical Course as of 02/28/21 2312  Sat Feb 28, 2021  1211 Prior ECG recovered from 02/10/21 showing RBBB pattern with same T wave inversions as current [MT]  1342 Called lab - they are checking into troponin results, which are still pending [MT]  1402 Initial troponin is unremarkable.  Patient is at [facility] now.  Glucose returned at 69, which is on the low side, but likely should not be causing symptoms.  We can give him some sips of juice. [MT]  43 Dr Renella Cunas from cardiology to come see patient. [MT]  2000 Cardiology has evaluated the patient.  Their team is agreed to an observation stay overnight, to trend the patient's troponins and evaluate for recurring symptoms.  It also like more time to evaluate his prior records as well as bypass.  The most reasonable course been observation admission at this time.  Although there are no acute vascular occlusions or evidence of dissection, the inpatient team may consider an inpatient consultation to vascular surgery or CT surgery given the degree of his pathology. [MT]  2130 I spoke to Dr Roxan Hockey from CT surgery who advised if there are no acute concerns for dissection or other cardiac surgical emergencies (at this time I believe there are not), the patient can follow up with CT surgery as an outpatient in the office for his thoracic aneurysm.  If his clinical status changes in the hospital their team can be consulted emergently.  I also spoke to Dr Scot Dock from vascular surgery regarding his aneurysms and vascular stenosis diffusely noted on CTA of the abdomen.  He will see the patient tomorrow.  At this time  there is no indication of active dissection, mesenteric ischemia or ischemic gut or renal failure from the pathology. [MT]    Clinical Course User Index [MT] Emeka Lindner, Carola Rhine, MD    Final Clinical Impression(s) / ED Diagnoses Final diagnoses:  Thoracic aortic aneurysm without rupture [facility])  AAA (abdominal aortic aneurysm) without rupture [facility])    Rx / DC Orders ED Discharge Orders      None        Wyvonnia Dusky, MD 02/28/21 2312

## 2021-03-01 DIAGNOSIS — I716 Thoracoabdominal aortic aneurysm, without rupture: Secondary | ICD-10-CM | POA: Diagnosis not present

## 2021-03-01 LAB — BASIC METABOLIC PANEL
Anion gap: 10 (ref 5–15)
BUN: 12 mg/dL (ref 8–23)
CO2: 26 mmol/L (ref 22–32)
Calcium: 8.6 mg/dL — ABNORMAL LOW (ref 8.9–10.3)
Chloride: 103 mmol/L (ref 98–111)
Creatinine, Ser: 1.18 mg/dL (ref 0.61–1.24)
GFR, Estimated: >60 mL/min (ref >60)
Glucose, Bld: 107 mg/dL — ABNORMAL HIGH (ref 70–99)
Potassium: 3.6 mmol/L (ref 3.5–5.1)
Sodium: 139 mmol/L (ref 135–145)

## 2021-03-01 LAB — TROPONIN I (HIGH SENSITIVITY)
Troponin I (High Sensitivity): 26 ng/L — ABNORMAL HIGH (ref <18)
Troponin I (High Sensitivity): 27 ng/L — ABNORMAL HIGH (ref <18)

## 2021-03-01 LAB — TSH: TSH: 5.856 u[IU]/mL — ABNORMAL HIGH (ref 0.350–4.500)

## 2021-03-01 MED ORDER — ENSURE ENLIVE PO LIQD
237.0000 mL | Freq: Two times a day (BID) | ORAL | Status: DC
  Administered 2021-03-01: 237 mL via ORAL

## 2021-03-01 NOTE — Discharge Summary (Signed)
Physician Discharge Summary  Keith Watson. [id number] DOB: 07-21-1934 DOA: 02/28/2021  PCP: Nicoletta Dress, MD  Admit date: 02/28/2021 Discharge date: 03/01/2021  Admitted From: home Disposition:  home  Recommendations for Outpatient Follow-up:  Follow up with PCP, cardiology vascular as OP.  Home Health:no  Equipment/Devices: none  Discharge Condition: Stable Code Status:   Code Status: Full Code Diet recommendation:  Diet Order             Diet - low sodium heart healthy           Diet Heart Room service appropriate? Yes; Fluid consistency: Thin  Diet effective now                    Brief/Interim Summary:  87 year old male with history of hypertension CAD, GERD, HLD erectile dysfunction, PVD, thyroid disease presents to the ED with complaint of feeling "jittery and jumpy.  As per the report he went to see his PCP and PCP was concerned about some changes on the EKG and sent to the ED.. In the ED patient underwent further work-up troponin 15> 20> 27> 26, routine labs are stable, LDL 107.  Underwent CT Skains that showed thoracic, aortic aneurysm and history of arterial occlusive disease, renal artery stenosis. Cardiology consulted vascular surgery consult and patient was admitted overnight Vascular advised outpatient close follow-up for patient's aneurysm and mesenteric artery and renal artery stenosis, no indication for aggressive treatment currently. Cardiology also saw and given patient's moderately controlled hypertension advised on changing medication-stopping his atenolol, increasing lisinopril to 40 mg and adding amlodipine 5 mg to optimize blood pressure.  Discharge Diagnoses:   Tremors/feeling jittery: Transient episode resolved.  Advised hypertension management changes are suggested as above but blood pressure soft at this time advised patient to continue on lisinopril 30 mg stop atenolol can consider adding amlodipine to 5 mg and increasing to  lisinopril 40 mg if blood pressure remains uncontrolled.  Hypertension management as above blood pressure currently soft and controlled in 100.  Stop atenolol keep on lisinopril and further meds adjustment as above as outpatient  Hypothyroidism: TSH slightly elevated 5.8 advised outpatient follow-up  Aortic aneurysm/thoracic aneurysm/renal artery stenosis/mesenteric artery occlusive disease/PVD: Seen by vascular surgeon and no need for inpatient aggressive management at this time and outpatient follow-up advised.  He will continue his aspirin Plavix and Lipitor CAD no acute ischemic change on the EKG troponin 15> 20> 27> 26, no further inpatient cardiac work-up advised by cardiology HLD continue Lipitor/Zetia  Consults: Cardiology, vascular surgery  Subjective: Resting comfortably,no tremors no chest pain no shortness of breath. Discharge Exam: Vitals:   03/01/21 0352 03/01/21 1114  BP: (!) 94/51 (!) 101/51  Pulse: 64 64  Resp: 19 (!) 25  Temp: 98.5 F (36.9 C)   SpO2: 95%    General: Pt is alert, awake, not in acute distress Cardiovascular: RRR, S1/S2 +, no rubs, no gallops Respiratory: CTA bilaterally, no wheezing, no rhonchi Abdominal: Soft, NT, ND, bowel sounds + Extremities: no edema, no cyanosis  Discharge Instructions  Discharge Instructions     Diet - low sodium heart healthy   Complete by: As directed    Discharge instructions   Complete by: As directed    Monitor blood pressure every day at home.  If blood pressure starts to run high > 150-160: Consider increasing lisinopril to 40 mg and adding amlodipine 5 mg.  Please call MD as soon as you get discharged for follow-up.  Please call  your vascular surgery for follow-up   Please call call MD or return to ER for similar or worsening recurring problem that brought you to hospital or if any fever,nausea/vomiting,abdominal pain, uncontrolled pain, chest pain,  shortness of breath or any other alarming  symptoms.  Please follow-up your doctor as instructed in a week time and call the office for appointment.  Please avoid alcohol, smoking, or any other illicit substance and maintain healthy habits including taking your regular medications as prescribed.  You were cared for by a hospitalist during your hospital stay. If you have any questions about your discharge medications or the care you received while you were in the hospital after you are discharged, you can call the unit and ask to speak with the hospitalist on call if the hospitalist that took care of you is not available.  Once you are discharged, your primary care physician will handle any further medical issues. Please note that NO REFILLS for any discharge medications will be authorized once you are discharged, as it is imperative that you return to your primary care physician (or establish a relationship with a primary care physician if you do not have one) for your aftercare needs so that they can reassess your need for medications and monitor your lab values   Increase activity slowly   Complete by: As directed       Allergies as of 03/01/2021   No Known Allergies      Medication List     STOP taking these medications    atenolol 100 MG tablet Commonly known as: TENORMIN       TAKE these medications    aspirin EC 81 MG tablet Take 81 mg by mouth daily.   atorvastatin 80 MG tablet Commonly known as: LIPITOR Take 80 mg by mouth at bedtime.   clopidogrel 75 MG tablet Commonly known as: PLAVIX Take 75 mg by mouth daily.   cyanocobalamin 1000 MCG/ML injection Commonly known as: (VITAMIN B-12) Inject 1,000 mcg into the muscle every 30 (thirty) days.   ezetimibe 10 MG tablet Commonly known as: ZETIA Take 10 mg by mouth daily.   levothyroxine 150 MCG tablet Commonly known as: SYNTHROID Take 150 mcg by mouth daily before breakfast.   lisinopril 30 MG tablet Commonly known as: ZESTRIL Take 30 mg by mouth at  bedtime.   meclizine 25 MG tablet Commonly known as: ANTIVERT Take 25 mg by mouth 3 (three) times daily as needed for dizziness.   mirabegron ER 50 MG Tb24 tablet Commonly known as: MYRBETRIQ Take 50 mg by mouth daily.   nitroGLYCERIN 0.4 MG SL tablet Commonly known as: NITROSTAT Place 1 tablet (0.4 mg total) under the tongue every 5 (five) minutes as needed for chest pain.   traZODone 100 MG tablet Commonly known as: DESYREL Take 100 mg by mouth at bedtime.        Follow-up Information     Richardo Priest, MD Follow up.   Specialties: Cardiology, Radiology Why: [facility] [place] cardiology office will call you to arrange a follow-up visit to be seen within the next upcoming months (moved up from 12 months as previously suggested at last visit). Contact information: [place] [address] [phone]         Nicoletta Dress, MD Follow up in 1 week(s).   Specialty: Internal Medicine Contact information: [place] [address] [phone]         Richardo Priest,  MD .   Specialties: Cardiology, Radiology Contact information: [place] [address] [phone]         Angelia Mould, MD. Call in 1 week(s).   Specialties: Vascular Surgery, Cardiology Contact information: [address] [place] [address] [phone]                No Known Allergies  The results of significant diagnostics from this hospitalization (including imaging, microbiology, ancillary and laboratory) are listed below for reference.    Microbiology: Recent Results (from the past 240 hour(s))  Resp Panel by RT-PCR (Flu A&B, Covid) Nasopharyngeal Swab     Status: None   Collection Time: 02/28/21  1:59 PM   Specimen: Nasopharyngeal Swab; Nasopharyngeal(NP) swabs in vial transport medium  Result Value Ref Range Status   SARS Coronavirus 2 by RT PCR NEGATIVE NEGATIVE Final    Comment:  (NOTE) SARS-CoV-2 target nucleic acids are NOT DETECTED.  The SARS-CoV-2 RNA is generally detectable in upper respiratory specimens during the acute phase of infection. The lowest concentration of SARS-CoV-2 viral copies this assay can detect is 138 copies/mL. A negative result does not preclude SARS-Cov-2 infection and should not be used as the sole basis for treatment or other patient management decisions. A negative result may occur with  improper specimen collection/handling, submission of specimen other than nasopharyngeal swab, presence of viral mutation(s) within the areas targeted by this assay, and inadequate number of viral copies(<138 copies/mL). A negative result must be combined with clinical observations, patient history, and epidemiological information. The expected result is Negative.  Fact Sheet for Patients:  EntrepreneurPulse.com.au  Fact Sheet for Healthcare Providers:  IncredibleEmployment.be  This test is no t yet approved or cleared by the [place] FDA and  has been authorized for detection and/or diagnosis of SARS-CoV-2 by FDA under an Emergency Use Authorization (EUA). This EUA will remain  in effect (meaning this test can be used) for the duration of the COVID-19 declaration under Section 564(b)(1) of the Act, 21 U.S.C.section 360bbb-3(b)(1), unless the authorization is terminated  or revoked sooner.       Influenza A by PCR NEGATIVE NEGATIVE Final   Influenza B by PCR NEGATIVE NEGATIVE Final    Comment: (NOTE) The Xpert Xpress SARS-CoV-2/FLU/RSV plus assay is intended as an aid in the diagnosis of influenza from Nasopharyngeal swab specimens and should not be used as a sole basis for treatment. Nasal washings and aspirates are unacceptable for Xpert Xpress SARS-CoV-2/FLU/RSV testing.  Fact Sheet for Patients: EntrepreneurPulse.com.au  Fact Sheet for Healthcare  Providers: IncredibleEmployment.be  This test is not yet approved or cleared by the [place] FDA and has been authorized for detection and/or diagnosis of SARS-CoV-2 by FDA under an Emergency Use Authorization (EUA). This EUA will remain in effect (meaning this test can be used) for the duration of the COVID-19 declaration under Section 564(b)(1) of the Act, 21 U.S.C. section 360bbb-3(b)(1), unless the authorization is terminated or revoked.  Performed at [facility], [place] [address], [place], [place] [address]     Procedures/Studies: DG Chest 1 View  Result Date: 02/28/2021 CLINICAL DATA:  Patient woke up feeling anxious and shaky this morning. No pain. EKG changes. EXAM: CHEST  1 VIEW COMPARISON:  June 19, 2010 FINDINGS: There is a convexity to the descending thoracic aorta not seen on the comparison study. The remainder of the thoracic aorta is tortuous but unchanged. The hila and mediastinum are unremarkable. The heart size is normal. No pneumothorax.  No suspicious nodules or masses. No focal infiltrates. IMPRESSION: 1. There is a new convexity to the descending thoracic aorta raising the possibility of aneurysm/aortic pathology. Recommend a CT angiogram of the chest to better evaluate the aorta. 2. No other abnormalities. Electronically Signed   By: Dorise Bullion III M.D   On: 02/28/2021 11:24   CT Angio Chest/Abd/Pel for Dissection W and/or Wo Contrast  Result Date: 02/28/2021 CLINICAL DATA:  Epigastric abdominal pain. New convexity of the descending thoracic aorta on a chest radiograph today. Clinical concern for aortic dissection. EXAM: CT ANGIOGRAPHY CHEST, ABDOMEN AND PELVIS TECHNIQUE: Non-contrast CT of the chest was initially obtained. Multidetector CT imaging through the chest, abdomen and pelvis was performed using the standard protocol during bolus administration of intravenous contrast. Multiplanar reconstructed images and MIPs were obtained  and reviewed to evaluate the vascular anatomy. CONTRAST:  160m OMNIPAQUE IOHEXOL 350 MG/ML SOLN COMPARISON:  Chest radiographs obtained earlier today and on 06/19/2010. Chest CT dated 12/15/2009. FINDINGS: CTA CHEST FINDINGS Cardiovascular: Focal aneurysmal dilatation of the mid descending thoracic aorta with a maximum diameter of 5.2 cm on image number 85/5. This contains mural thrombus. Mild aneurysmal dilatation of the aortic arch, measuring 4.2 cm in maximum diameter on image number 44/5. The ascending thoracic aorta and distal descending thoracic aorta are normal in caliber. No dissection seen. Mildly enlarged heart. Atheromatous calcifications, including the coronary arteries and aorta. Post CABG changes. Mediastinum/Nodes: No enlarged mediastinal, hilar, or axillary lymph nodes. Thyroid gland, trachea, and esophagus demonstrate no significant findings. Moderate-sized hiatal hernia. Lungs/Pleura: Interval multiple small nodular densities in the inferior right lower lobe. These include a 3 mm nodule on image number 97/9 in multiple adjacent nodules in the as ago esophageal recess the medial right lung base, the largest measuring 9 mm on image number 118/9. No nodules elsewhere in either lung. No pleural fluid. Mild left lower lobe paraseptal bullous emphysema. Musculoskeletal: Thoracic and lower cervical spine degenerative changes. Median sternotomy wires. Review of the MIP images confirms the above findings. CTA ABDOMEN AND PELVIS FINDINGS VASCULAR Aorta: Aneurysmal dilatation of the proximal infrarenal abdominal aorta and distal infrarenal abdominal aorta with mural thrombus at both locations. Proximally, this measures 4.4 cm in maximum diameter on image 192/5. Distally, this measures 4.1 cm in maximum diameter on image number 218/5. No dissection seen. Celiac: Calcified and noncalcified plaque at the origin of the celiac axis causing approximately 90% stenosis with normal opacification distally. SMA:  Calcified plaque at the origin causing approximately 60% stenosis. Normal opacification distally. Renals: Plaque at the origin of the left renal artery producing approximately 60% stenosis. Two right renal arteries. Plaque formation at the origin of the more superior right renal artery producing approximately 90% stenosis. Plaque formation at the origin of the more inferior artery causing approximately 70% stenosis. IMA: Plaque formation at the origin producing approximately 80% stenosis. Inflow: Minimal fusiform aneurysmal dilatation of the left common iliac artery with a maximum diameter of 1.7 cm. Focal aneurysmal dilatation of the distal right common iliac artery with a maximum diameter of 2.0 cm. Proximal left internal iliac artery aneurysm with extensive mural thrombus, measuring 4.1 cm in maximum diameter on image number 250/5. There is a 2nd area of focal aneurysmal dilatation of the distal left internal iliac artery with extensive mural thrombus, measuring 3.8 cm in maximum diameter on image number 27/5. The lumen is patent through these areas. Additional atheromatous changes involving the rest of the iliac arteries bilaterally and proximal femoral arteries without  significant stenosis. Veins: No obvious venous abnormality within the limitations of this arterial phase study. Review of the MIP images confirms the above findings. NON-VASCULAR Hepatobiliary: Mild diffuse low density of the liver relative to the spleen. Normal appearing gallbladder. Pancreas: Moderate diffuse pancreatic atrophy. Spleen: Normal in size without focal abnormality. Adrenals/Urinary Tract: Normal appearing adrenal glands. Upper pole right renal cyst. Normal appearing left kidney. Unremarkable bladder and ureters. Stomach/Bowel: Moderate-sized hiatal hernia. Multiple colonic diverticula without evidence of diverticulitis. No evidence of appendicitis. Unremarkable small bowel. Lymphatic: No enlarged lymph nodes. Reproductive: Mildly  to moderately enlarged prostate gland. Other: Small umbilical hernia containing fat. Musculoskeletal: Lumbar spine degenerative changes including facet degenerative changes with associated grade 1 anterolisthesis at the L4-5 level. No fractures or pars defects. Review of the MIP images confirms the above findings. IMPRESSION: 1. No aortic dissection or other acute abnormality. 2. Interval aneurysmal dilatation of the aortic arch and proximal and mid descending thoracic aorta, most pronounced at the level of the midthoracic aorta with a maximum diameter of 5.2 cm. The distal arch measures 4.2 cm in maximum diameter. Recommend semi-annual imaging followup by CTA or MRA and referral to cardiothoracic surgery if not already obtained. This recommendation follows 2010 ACCF/AHA/AATS/ACR/ASA/SCA/SCAI/SIR/STS/SVM Guidelines for the Diagnosis and Management of Patients With Thoracic Aortic Disease. Circulation. 2010; [id number]. Aortic aneurysm NOS (ICD10-I71.9) 3. Focal aneurysmal dilatation of the proximal and distal portions of the infrarenal abdominal aorta with a maximum diameter of 4.4 cm proximally and 4.1 cm distally. Recommend follow-up every 12 months and vascular consultation. This recommendation follows ACR consensus guidelines: White Paper of the ACR Incidental Findings Committee II on Vascular Findings. J Am Coll Radiol 2013; 10:789-794. 4. 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 5. Plaque formation producing approximately 90% stenosis of the origin of the celiac axis, 60% stenosis of the origin of the superior mesenteric artery, 60% stenosis of the origin of the left renal artery, 90% stenosis of the origin of the more cephalad right renal artery, 70% stenosis of the origin of the more caudal right renal artery and 80% stenosis of the origin of the inferior mesenteric  artery. 6.  Calcific coronary artery and aortic atherosclerosis. 7. Mild cardiomegaly. 8. Moderate-sized hiatal hernia. 9. Interval multiple nodules in the right lower lobe. Non-contrast chest CT at 3-6 months is recommended. If the nodules are stable at time of repeat CT, then future CT at 18-24 months (from today's scan) is considered optional for low-risk patients, but is recommended for high-risk patients. This recommendation follows the consensus statement: Guidelines for Management of Incidental Pulmonary Nodules Detected on CT Images: From the Fleischner Society 2017; Radiology 2017; 284:228-243. 10. Mild paraseptal bullous emphysema. 11. Mild hepatic steatosis. Electronically Signed   By: Claudie Revering M.D.   On: 02/28/2021 15:33    Labs: BNP (last 3 results) No results for input(s): BNP in the last 8760 hours. Basic Metabolic Panel: Recent Labs  Lab 02/28/21 1038 03/01/21 0050  NA 138 139  K 4.2 3.6  CL 102 103  CO2 20* 26  GLUCOSE 69* 107*  BUN 11 12  CREATININE 1.17 1.18  CALCIUM 9.1 8.6*   Liver Function Tests: Recent Labs  Lab 02/28/21 1038  AST 37  ALT 22  ALKPHOS 81  BILITOT 1.1  PROT 6.9  ALBUMIN 3.8   No results for input(s): LIPASE, AMYLASE in the  last 168 hours. No results for input(s): AMMONIA in the last 168 hours. CBC: Recent Labs  Lab 02/28/21 1038  WBC 6.9  NEUTROABS 5.1  HGB 16.0  HCT 51.3  MCV 99.4  PLT 184   Cardiac Enzymes: No results for input(s): CKTOTAL, CKMB, CKMBINDEX, TROPONINI in the last 168 hours. BNP: Invalid input(s): POCBNP CBG: Recent Labs  Lab 02/28/21 1859  GLUCAP 176*   D-Dimer No results for input(s): DDIMER in the last 72 hours. Hgb A1c Recent Labs    02/28/21 2305  HGBA1C 5.3   Lipid Profile Recent Labs    02/28/21 2305  CHOL 192  HDL 68  LDLCALC 107*  TRIG 87  CHOLHDL 2.8   Thyroid function studies Recent Labs    02/28/21 2305  TSH 5.856*   Anemia work up No results for input(s): VITAMINB12,  FOLATE, FERRITIN, TIBC, IRON, RETICCTPCT in the last 72 hours. Urinalysis    Component Value Date/Time   COLORURINE YELLOW 02/28/2021 1404   APPEARANCEUR CLEAR 02/28/2021 1404   LABSPEC 1.036 (H) 02/28/2021 1404   PHURINE 6.0 02/28/2021 1404   GLUCOSEU NEGATIVE 02/28/2021 1404   HGBUR NEGATIVE 02/28/2021 1404   BILIRUBINUR NEGATIVE 02/28/2021 1404   KETONESUR 20 (A) 02/28/2021 1404   PROTEINUR NEGATIVE 02/28/2021 1404   NITRITE NEGATIVE 02/28/2021 1404   LEUKOCYTESUR NEGATIVE 02/28/2021 1404   Sepsis Labs Invalid input(s): PROCALCITONIN,  WBC,  LACTICIDVEN Microbiology Recent Results (from the past 240 hour(s))  Resp Panel by RT-PCR (Flu A&B, Covid) Nasopharyngeal Swab     Status: None   Collection Time: 02/28/21  1:59 PM   Specimen: Nasopharyngeal Swab; Nasopharyngeal(NP) swabs in vial transport medium  Result Value Ref Range Status   SARS Coronavirus 2 by RT PCR NEGATIVE NEGATIVE Final    Comment: (NOTE) SARS-CoV-2 target nucleic acids are NOT DETECTED.  The SARS-CoV-2 RNA is generally detectable in upper respiratory specimens during the acute phase of infection. The lowest concentration of SARS-CoV-2 viral copies this assay can detect is 138 copies/mL. A negative result does not preclude SARS-Cov-2 infection and should not be used as the sole basis for treatment or other patient management decisions. A negative result may occur with  improper specimen collection/handling, submission of specimen other than nasopharyngeal swab, presence of viral mutation(s) within the areas targeted by this assay, and inadequate number of viral copies(<138 copies/mL). A negative result must be combined with clinical observations, patient history, and epidemiological information. The expected result is Negative.  Fact Sheet for Patients:  EntrepreneurPulse.com.au  Fact Sheet for Healthcare Providers:  IncredibleEmployment.be  This test is no t yet  approved or cleared by the [place] FDA and  has been authorized for detection and/or diagnosis of SARS-CoV-2 by FDA under an Emergency Use Authorization (EUA). This EUA will remain  in effect (meaning this test can be used) for the duration of the COVID-19 declaration under Section 564(b)(1) of the Act, 21 U.S.C.section 360bbb-3(b)(1), unless the authorization is terminated  or revoked sooner.       Influenza A by PCR NEGATIVE NEGATIVE Final   Influenza B by PCR NEGATIVE NEGATIVE Final    Comment: (NOTE) The Xpert Xpress SARS-CoV-2/FLU/RSV plus assay is intended as an aid in the diagnosis of influenza from Nasopharyngeal swab specimens and should not be used as a sole basis for treatment. Nasal washings and aspirates are unacceptable for Xpert Xpress SARS-CoV-2/FLU/RSV testing.  Fact Sheet for Patients: EntrepreneurPulse.com.au  Fact Sheet for Healthcare Providers: IncredibleEmployment.be  This test is not yet approved  or cleared by the [place] and has been authorized for detection and/or diagnosis of SARS-CoV-2 by FDA under an Emergency Use Authorization (EUA). This EUA will remain in effect (meaning this test can be used) for the duration of the COVID-19 declaration under Section 564(b)(1) of the Act, 21 U.S.C. section 360bbb-3(b)(1), unless the authorization is terminated or revoked.  Performed at [facility], [place] [address], [place], [place] [address]    Time coordinating discharge: 25 minutes  SIGNED: Antonieta Pert, MD  [facility] 03/01/2021, 11:21 AM  If 7PM-7AM, please contact night-coverage [email or web address]

## 2021-03-01 NOTE — Progress Notes (Signed)
Dr. Quentin Ore reviewed chart and agrees with s/o. Patient recently just saw Dr. Bettina Gavia so no follow-up was planned for 1 yr. Per d/w Dr. Quentin Ore would move up to the next few months. I have sent a message to our office's scheduling team requesting a follow-up appointment, and our office will call the patient with this information.

## 2021-03-02 DIAGNOSIS — G459 Transient cerebral ischemic attack, unspecified: Secondary | ICD-10-CM | POA: Insufficient documentation

## 2021-03-02 NOTE — Progress Notes (Signed)
Cardiology Office Note:    Date:  03/03/2021   ID:  Keith Client., DOB Apr 01, 1934, MRN [id number]  PCP:  Nicoletta Dress, MD  Cardiologist:  Keith More, MD    Referring MD: Nicoletta Dress, MD    ASSESSMENT:    1. Essential hypertension   2. Mixed hyperlipidemia   3. Coronary artery disease involving coronary bypass graft of native heart without angina pectoris   4. Thoracic aortic aneurysm without rupture ([place])   5. AAA (abdominal aortic aneurysm) without rupture (HCC)    PLAN:    In order of problems listed above:  Blood pressure is well controlled taking less antihypertensive agent I will give him a prescription for clonidine that can be taken for blood pressures greater than 180/100 continue his ACE inhibitor. Stable continue statin Stable CAD did not have a presentation of ACS at this time would not do an ischemia evaluation Follow-up vascular surgery regarding his thoracic and abdominal aneurysms   Next appointment: 6 months   Medication Adjustments/Labs and Tests Ordered: Current medicines are reviewed at length with the patient today.  Concerns regarding medicines are outlined above.  No orders of the defined types were placed in this encounter.  No orders of the defined types were placed in this encounter.   Chief Complaint  Patient presents with   Follow-up   Hypertension    History of Present Illness:    Keith Ullery. is a 87 y.o. male with a hx of CAD bypass surgery 2011 stroke 2020 hypertension dyslipidemia carotid artery stenosis 50 to 69% on the left last seen 02/10/2021.  Compliance with diet, lifestyle and medications: Yes His daughter is present participates in evaluation decision making She is concerned about alcohol and is going to limit to 1 bourbon a day. His hospital admission was associated with uncontrolled hypertension he was not having chest pain He has arrangements for follow-up CT angiography in 6  months Home blood pressures are followed twice a day and range between [phone] blood pressure medications were only altered and withdrawn to beta-blocker. He is having no angina shortness of breath palpitation or syncope  He was recently admitted to [facility] 02/28/2021 presenting with chest discomfort normal high-sensitivity troponins and a CT scan showed thoracic and abdominal aortic aneurysm and was seen by vascular surgery recommended conservative management.  His blood pressure was poorly controlled and medications were adjusted.  CTA reviewed there was no acute aortic dissection or injury.  There is aneurysmal dilatation of the aortic arch and proximal mid thoracic aorta maximum diameter 5.2 cm distal arch measures 4.2 cm.  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  Impression vascular surgery was that the thoracic aortic disease was not large enough to consider repair and he would have a follow-up image performed in 6 months.  He also had a 4.4 sonometer infrarenal abdominal aortic rhythm.  He had bilateral renal artery disease with 60% stenosis of the origin of the left renal artery and 90 and 70% stenosis of the right renal arteries.  Past Medical History:  Diagnosis Date   Adult hypothyroidism    Benign essential hypertension    CAD (coronary artery disease) 01/08/2019   Cancer ([place]) 01/08/2019   Of the  Left submandibular gland   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HCC)    Chronic GERD    Controlled insomnia    Elevated fasting blood sugar    Erectile disorder due to medical condition in male    Hyperlipidemia 01/08/2019   Hypertension 01/08/2019   Multi-vessel coronary artery stenosis    Olecranon bursitis of left elbow    PVD  (peripheral vascular disease) ([place])    Thyroid disease 01/08/2019   TIA (transient ischemic attack)     Past Surgical History:  Procedure Laterality Date   CORONARY ARTERY BYPASS GRAFT  2011   ELBOW SURGERY  10/2017   Bursitis   SALIVARY GLAND SURGERY  2016   Removal due to cancer   TONSILLECTOMY  1957    Current Medications: Current Meds  Medication Sig   aspirin EC 81 MG tablet Take 81 mg by mouth daily.   atorvastatin (LIPITOR) 80 MG tablet Take 80 mg by mouth at bedtime.   clopidogrel (PLAVIX) 75 MG tablet Take 75 mg by mouth daily.   cyanocobalamin (,VITAMIN B-12,) 1000 MCG/ML injection Inject 1,000 mcg into the muscle every 30 (thirty) days.   ezetimibe (ZETIA) 10 MG tablet Take 10 mg by mouth daily.   levothyroxine (SYNTHROID) 150 MCG tablet Take 150 mcg by mouth daily before breakfast.   lisinopril (ZESTRIL) 30 MG tablet Take 30 mg by mouth at bedtime.   meclizine (ANTIVERT) 25 MG tablet Take 25 mg by mouth 3 (three) times daily as needed for dizziness.   mirabegron ER (MYRBETRIQ) 50 MG TB24 tablet Take 50 mg by mouth daily.   omeprazole (PRILOSEC) 20 MG capsule Take 20 mg by mouth daily.   traZODone (DESYREL) 100 MG tablet Take 100 mg by mouth at bedtime.   [DISCONTINUED] nitroGLYCERIN (NITROSTAT) 0.4 MG SL tablet Place 1 tablet (0.4 mg total) under the tongue every 5 (five) minutes as needed for chest pain.     Allergies:   Patient has no known allergies.   Social History   Socioeconomic History   Marital status: Married    Spouse name: Not on file   Number of children: Not on file   Years of education: Not on file   Highest education level: Not on file  Occupational History   Occupation: Physiological scientist    Comment: Retired  Tobacco Use   Smoking status: Former    Packs/day: 1.00    Years: 10.00    Pack years: 10.00    Types: Cigarettes    Quit date: 1970    Years since quitting: 52.6   Smokeless tobacco: Never   Tobacco comments:    quit 45 years   Vaping Use   Vaping Use: Never used  Substance and Sexual Activity   Alcohol use: Yes    Alcohol/week: 1.0 - 2.0 standard drink    Types: 1 - 2 Standard drinks or equivalent per week    Comment: 1-2 cocktails daily   Drug use: Never   Sexual activity: Not on file  Other Topics Concern   Not on file  Social History Narrative   Not on file   Social Determinants of Health   Financial Resource Strain: Not on file  Food Insecurity: Not on file  Transportation Needs: Not on file  Physical Activity: Not on file  Stress: Not on file  Social Connections: Not on file     Family History: The patient's family history includes CAD in his mother; Heart attack in his father and mother; Heart  disease in his father; Hypertension in his mother. ROS:   Please see the history of present illness.    All other systems reviewed and are negative.  EKGs/Labs/Other Studies Reviewed:    The following studies were reviewed today:    Recent Labs: 02/28/2021: ALT 22; Hemoglobin 16.0; Platelets 184; TSH 5.856 03/01/2021: BUN 12; Creatinine, Ser 1.18; Potassium 3.6; Sodium 139  Recent Lipid Panel    Component Value Date/Time   CHOL 192 02/28/2021 2305   CHOL 156 02/11/2021 0830   TRIG 87 02/28/2021 2305   HDL 68 02/28/2021 2305   HDL 71 02/11/2021 0830   CHOLHDL 2.8 02/28/2021 2305   VLDL 17 02/28/2021 2305   LDLCALC 107 (H) 02/28/2021 2305   LDLCALC 70 02/11/2021 0830    Physical Exam:    VS:  BP 119/76 (BP Location: Left Arm, Patient Position: Sitting, Cuff Size: Normal)   Pulse 76   Ht '5\' 10"'$  (1.778 m)   Wt 158 lb 1.3 oz (71.7 kg)   SpO2 99%   BMI 22.68 kg/m     Wt Readings from Last 3 Encounters:  03/03/21 158 lb 1.3 oz (71.7 kg)  03/01/21 155 lb 14.4 oz (70.7 kg)  02/10/21 155 lb 12.8 oz (70.7 kg)     GEN:  Well nourished, well developed in no acute distress HEENT: Normal NECK: No JVD; No carotid bruits LYMPHATICS: No lymphadenopathy CARDIAC: RRR, no murmurs, rubs,  gallops RESPIRATORY:  Clear to auscultation without rales, wheezing or rhonchi  ABDOMEN: Soft, non-tender, non-distended MUSCULOSKELETAL:  No edema; No deformity  SKIN: Warm and dry NEUROLOGIC:  Alert and oriented x 3 PSYCHIATRIC:  Normal affect    Signed, Keith More, MD  03/03/2021 2:25 PM    [place]

## 2021-03-03 ENCOUNTER — Other Ambulatory Visit: Payer: Self-pay

## 2021-03-03 ENCOUNTER — Encounter: Payer: Self-pay | Admitting: Cardiology

## 2021-03-03 ENCOUNTER — Ambulatory Visit: Payer: Medicare PPO | Admitting: Cardiology

## 2021-03-03 VITALS — BP 119/76 | HR 76 | Ht 70.0 in | Wt 158.1 lb

## 2021-03-03 DIAGNOSIS — E782 Mixed hyperlipidemia: Secondary | ICD-10-CM | POA: Diagnosis not present

## 2021-03-03 DIAGNOSIS — I10 Essential (primary) hypertension: Secondary | ICD-10-CM

## 2021-03-03 DIAGNOSIS — R9431 Abnormal electrocardiogram [ECG] [EKG]: Secondary | ICD-10-CM | POA: Diagnosis not present

## 2021-03-03 DIAGNOSIS — I714 Abdominal aortic aneurysm, without rupture, unspecified: Secondary | ICD-10-CM

## 2021-03-03 DIAGNOSIS — I2510 Atherosclerotic heart disease of native coronary artery without angina pectoris: Secondary | ICD-10-CM | POA: Diagnosis not present

## 2021-03-03 DIAGNOSIS — R1310 Dysphagia, unspecified: Secondary | ICD-10-CM | POA: Diagnosis not present

## 2021-03-03 DIAGNOSIS — I25810 Atherosclerosis of coronary artery bypass graft(s) without angina pectoris: Secondary | ICD-10-CM | POA: Diagnosis not present

## 2021-03-03 DIAGNOSIS — I712 Thoracic aortic aneurysm, without rupture, unspecified: Secondary | ICD-10-CM

## 2021-03-03 DIAGNOSIS — R0789 Other chest pain: Secondary | ICD-10-CM | POA: Diagnosis not present

## 2021-03-03 DIAGNOSIS — R7989 Other specified abnormal findings of blood chemistry: Secondary | ICD-10-CM | POA: Diagnosis not present

## 2021-03-03 MED ORDER — CLONIDINE HCL 0.1 MG PO TABS: 0.1000 mg | ORAL_TABLET | Freq: Every evening | ORAL | 11 refills | Status: DC | PRN

## 2021-03-03 MED ORDER — NITROGLYCERIN 0.4 MG SL SUBL: 0.4000 mg | SUBLINGUAL_TABLET | SUBLINGUAL | 3 refills | Status: DC | PRN

## 2021-03-03 NOTE — Patient Instructions (Addendum)
Medication Instructions:  Your physician has recommended you make the following change in your medication:  START CLONIDINE 0.'1MG'$  AT BEDTIME AS NEEDED FOR BP GREATER THAN 180/100 LIMIT 1 DRINK PER DAY  *If you need a refill on your cardiac medications before your next appointment, please call your pharmacy*   Lab Work: NONE If you have labs (blood work) drawn today and your tests are completely normal, you will receive your results only by: [place] (if you have MyChart) OR A paper copy in the mail If you have any lab test that is abnormal or we need to change your treatment, we will call you to review the results.   Testing/Procedures: NONE   Follow-Up: At [facility], you and your health needs are our priority.  As part of our continuing mission to provide you with exceptional heart care, we have created designated Provider Care Teams.  These Care Teams include your primary Cardiologist (physician) and Advanced Practice Providers (APPs -  Physician Assistants and Nurse Practitioners) who all work together to provide you with the care you need, when you need it.  We recommend signing up for the patient portal called "MyChart".  Sign up information is provided on this After Visit Summary.  MyChart is used to connect with patients for Virtual Visits (Telemedicine).  Patients are able to view lab/test results, encounter notes, upcoming appointments, etc.  Non-urgent messages can be sent to your provider as well.   To learn more about what you can do with MyChart, go to [email or web address].    Your next appointment:   6 month(s)  The format for your next appointment:   In Person  Provider:   Shirlee More, MD   Other Instructions

## 2021-03-04 DIAGNOSIS — Z0000 Encounter for general adult medical examination without abnormal findings: Secondary | ICD-10-CM | POA: Diagnosis not present

## 2021-03-04 DIAGNOSIS — E785 Hyperlipidemia, unspecified: Secondary | ICD-10-CM | POA: Diagnosis not present

## 2021-03-04 DIAGNOSIS — Z1331 Encounter for screening for depression: Secondary | ICD-10-CM | POA: Diagnosis not present

## 2021-03-04 DIAGNOSIS — Z9181 History of falling: Secondary | ICD-10-CM | POA: Diagnosis not present

## 2021-03-17 DIAGNOSIS — E538 Deficiency of other specified B group vitamins: Secondary | ICD-10-CM | POA: Diagnosis not present

## 2021-04-17 DIAGNOSIS — E538 Deficiency of other specified B group vitamins: Secondary | ICD-10-CM | POA: Diagnosis not present

## 2021-05-18 DIAGNOSIS — E538 Deficiency of other specified B group vitamins: Secondary | ICD-10-CM | POA: Diagnosis not present

## 2021-06-03 DIAGNOSIS — D519 Vitamin B12 deficiency anemia, unspecified: Secondary | ICD-10-CM | POA: Diagnosis not present

## 2021-06-03 DIAGNOSIS — Z139 Encounter for screening, unspecified: Secondary | ICD-10-CM | POA: Diagnosis not present

## 2021-06-03 DIAGNOSIS — E039 Hypothyroidism, unspecified: Secondary | ICD-10-CM | POA: Diagnosis not present

## 2021-06-03 DIAGNOSIS — R7301 Impaired fasting glucose: Secondary | ICD-10-CM | POA: Diagnosis not present

## 2021-06-03 DIAGNOSIS — H6123 Impacted cerumen, bilateral: Secondary | ICD-10-CM | POA: Diagnosis not present

## 2021-06-03 DIAGNOSIS — E785 Hyperlipidemia, unspecified: Secondary | ICD-10-CM | POA: Diagnosis not present

## 2021-06-03 DIAGNOSIS — I2510 Atherosclerotic heart disease of native coronary artery without angina pectoris: Secondary | ICD-10-CM | POA: Diagnosis not present

## 2021-06-03 DIAGNOSIS — I10 Essential (primary) hypertension: Secondary | ICD-10-CM | POA: Diagnosis not present

## 2021-06-03 DIAGNOSIS — Z23 Encounter for immunization: Secondary | ICD-10-CM | POA: Diagnosis not present

## 2021-06-19 DIAGNOSIS — E538 Deficiency of other specified B group vitamins: Secondary | ICD-10-CM | POA: Diagnosis not present

## 2021-07-20 DIAGNOSIS — E538 Deficiency of other specified B group vitamins: Secondary | ICD-10-CM | POA: Diagnosis not present

## 2021-07-23 ENCOUNTER — Other Ambulatory Visit: Payer: Self-pay

## 2021-07-23 DIAGNOSIS — I719 Aortic aneurysm of unspecified site, without rupture: Secondary | ICD-10-CM

## 2021-07-28 ENCOUNTER — Other Ambulatory Visit: Payer: Self-pay

## 2021-07-28 DIAGNOSIS — I719 Aortic aneurysm of unspecified site, without rupture: Secondary | ICD-10-CM

## 2021-08-19 ENCOUNTER — Other Ambulatory Visit: Payer: Medicare Other

## 2021-08-20 DIAGNOSIS — D519 Vitamin B12 deficiency anemia, unspecified: Secondary | ICD-10-CM | POA: Diagnosis not present

## 2021-09-03 ENCOUNTER — Other Ambulatory Visit: Payer: Self-pay

## 2021-09-03 ENCOUNTER — Encounter: Payer: Self-pay | Admitting: Cardiology

## 2021-09-03 ENCOUNTER — Ambulatory Visit: Payer: Medicare Other | Admitting: Cardiology

## 2021-09-03 VITALS — BP 114/64 | HR 84 | Ht 71.0 in | Wt 160.4 lb

## 2021-09-03 DIAGNOSIS — I714 Abdominal aortic aneurysm, without rupture, unspecified: Secondary | ICD-10-CM | POA: Diagnosis not present

## 2021-09-03 DIAGNOSIS — I712 Thoracic aortic aneurysm, without rupture, unspecified: Secondary | ICD-10-CM | POA: Diagnosis not present

## 2021-09-03 DIAGNOSIS — D519 Vitamin B12 deficiency anemia, unspecified: Secondary | ICD-10-CM | POA: Diagnosis not present

## 2021-09-03 DIAGNOSIS — I2510 Atherosclerotic heart disease of native coronary artery without angina pectoris: Secondary | ICD-10-CM | POA: Diagnosis not present

## 2021-09-03 DIAGNOSIS — I25810 Atherosclerosis of coronary artery bypass graft(s) without angina pectoris: Secondary | ICD-10-CM | POA: Diagnosis not present

## 2021-09-03 DIAGNOSIS — E782 Mixed hyperlipidemia: Secondary | ICD-10-CM | POA: Diagnosis not present

## 2021-09-03 DIAGNOSIS — G4700 Insomnia, unspecified: Secondary | ICD-10-CM | POA: Diagnosis not present

## 2021-09-03 DIAGNOSIS — E039 Hypothyroidism, unspecified: Secondary | ICD-10-CM | POA: Diagnosis not present

## 2021-09-03 DIAGNOSIS — R7301 Impaired fasting glucose: Secondary | ICD-10-CM | POA: Diagnosis not present

## 2021-09-03 DIAGNOSIS — I10 Essential (primary) hypertension: Secondary | ICD-10-CM

## 2021-09-03 DIAGNOSIS — E785 Hyperlipidemia, unspecified: Secondary | ICD-10-CM | POA: Diagnosis not present

## 2021-09-03 NOTE — Progress Notes (Signed)
Cardiology Office Note:    Date:  09/03/2021   ID:  Keith Client., DOB July 17, 1934, MRN [id number]  PCP:  Nicoletta Dress, MD  Cardiologist:  Shirlee More, MD    Referring MD: Nicoletta Dress, MD    ASSESSMENT:    1. Coronary artery disease involving coronary bypass graft of native heart without angina pectoris   2. Essential hypertension   3. Mixed hyperlipidemia   4. Thoracic aortic aneurysm without rupture, unspecified part   5. Abdominal aortic aneurysm (AAA) without rupture, unspecified part    PLAN:    In order of problems listed above:  Orlin continues to do well with his CAD, he is having no angina following remote CABG in 2011 on current medical therapy we will continue his chronic 2 antiplatelet therapy with diffuse vascular disease and stroke antihypertensives and combined lipid-lowering Zetia and Lipitor.  With the severity of his diffuse vascular disease I like to see an LDL less than 70 and ideally less than 55 Stable blood pressure is well controlled combination treatment including centrally active clonidine as needed and lisinopril. Stable continues combined lipid-lowering therapy he had labs drawn this morning copy is pending Recheck noncontrast CTs as described   Next appointment: 6 months   Medication Adjustments/Labs and Tests Ordered: Current medicines are reviewed at length with the patient today.  Concerns regarding medicines are outlined above.  Orders Placed This Encounter  Procedures   CT Chest Wo Contrast   CT ABDOMEN WO CONTRAST   No orders of the defined types were placed in this encounter.   Chief complaint follow-up for CAD as well as his thoracic and abdominal aneurysms.   History of Present Illness:    Keith Watson. is a 88 y.o. male with a hx of CAD CABG 2011 stroke 2020 hypertension dyslipidemia coronary artery disease left internal carotid artery 50 to 69% stenosis and diffuse arterial disease with thoracic and  abdominal aortic aneurysm evaluated and being followed by CT surgery in [place] last seen 03/18/2021.  Compliance with diet, lifestyle and medications: Yes  He no longer uses alcohol Had a visit with Dr. Delena Bali pending labs He takes care of his wife at home now with dementia he travels very little No chest pain shortness of breath palpitation syncope TIA He has no arrangements for follow-up with vascular surgery and I will go ahead and order noncontrast CT of the chest and abdomen to follow his thoracic and abdominal aortic aneurysms If progression is significant referred back to thoracic surgery although I think at this point in his life with his comorbidities he is not a good candidate for elective intervention   He was admitted to [facility] 02/28/2021 presenting with chest discomfort normal high-sensitivity troponins and a CT scan showed thoracic and abdominal aortic aneurysm and was seen by vascular surgery recommended conservative management.  His blood pressure was poorly controlled and medications were adjusted.   CTA reviewed there was no acute aortic dissection or injury.  There is aneurysmal dilatation of the aortic arch and proximal mid thoracic aorta maximum diameter 5.2 cm distal arch measures 4.2 cm.  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  Impression vascular surgery was that the thoracic aortic disease was not  large enough to consider repair and he would have a follow-up image performed in 6 months.  He also had a 4.4 sonometer infrarenal abdominal aortic rhythm.  He had bilateral renal artery disease with 60% stenosis of the origin of the left renal artery and 90 and 70% stenosis of the right renal arteries. Past  Medical History:  Diagnosis Date   Adult hypothyroidism    Benign essential hypertension    CAD (coronary artery disease) 01/08/2019   Cancer ([place]) 01/08/2019   Of the Left submandibular gland   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HCC)    Chronic GERD    Controlled insomnia    Elevated fasting blood sugar    Erectile disorder due to medical condition in male    Hyperlipidemia 01/08/2019   Hypertension 01/08/2019   Multi-vessel coronary artery stenosis    Olecranon bursitis of left elbow    PVD (peripheral vascular disease) ([place])    Thyroid disease 01/08/2019   TIA (transient ischemic attack)     Past Surgical History:  Procedure Laterality Date   CORONARY ARTERY BYPASS GRAFT  2011   ELBOW SURGERY  10/2017   Bursitis   SALIVARY GLAND SURGERY  2016   Removal due to cancer   TONSILLECTOMY  1957    Current Medications: Current Meds  Medication Sig   aspirin EC 81 MG tablet Take 81 mg by mouth daily.   atorvastatin (LIPITOR) 80 MG tablet Take 80 mg by mouth at bedtime.   cloNIDine (CATAPRES) 0.1 MG tablet Take 1 tablet (0.1 mg total) by mouth at bedtime as needed (Blood pressure greater than 180/100).   clopidogrel (PLAVIX) 75 MG tablet Take 75 mg by mouth daily.   cyanocobalamin (,VITAMIN B-12,) 1000 MCG/ML injection Inject 1,000 mcg into the muscle every 30 (thirty) days.   ezetimibe (ZETIA) 10 MG tablet Take 10 mg by mouth daily.   levothyroxine (SYNTHROID) 150 MCG tablet Take 150 mcg by mouth daily before breakfast.   lisinopril (ZESTRIL) 30 MG tablet Take 30 mg by mouth at bedtime.   meclizine (ANTIVERT) 25 MG tablet Take 25 mg by mouth 3 (three) times daily as needed for dizziness.   mirabegron ER (MYRBETRIQ) 50 MG TB24 tablet Take 50 mg by mouth daily.   nitroGLYCERIN (NITROSTAT) 0.4 MG SL tablet Place 0.4 mg under the tongue every 5 (five) minutes as needed for chest pain.   omeprazole (PRILOSEC) 20 MG capsule Take 20 mg by mouth daily.   traZODone (DESYREL) 100 MG tablet Take 100  mg by mouth at bedtime.     Allergies:   Patient has no known allergies.   Social History   Socioeconomic History   Marital status: Married    Spouse name: Not on file   Number of children: Not on file   Years of education: Not on file   Highest education level: Not on file  Occupational History   Occupation: Physiological scientist    Comment: Retired  Tobacco Use   Smoking status: Former    Packs/day: 1.00    Years: 10.00    Pack years: 10.00    Types: Cigarettes    Quit date: 1970    Years since quitting: 53.1   Smokeless tobacco: Never   Tobacco comments:    quit 45 years  Vaping Use   Vaping Use: Never used  Substance and Sexual Activity   Alcohol use: Yes    Alcohol/week: 1.0 - 2.0 standard drink    Types: 1 - 2 Standard  drinks or equivalent per week    Comment: 1-2 cocktails daily   Drug use: Never   Sexual activity: Not on file  Other Topics Concern   Not on file  Social History Narrative   Not on file   Social Determinants of Health   Financial Resource Strain: Not on file  Food Insecurity: Not on file  Transportation Needs: Not on file  Physical Activity: Not on file  Stress: Not on file  Social Connections: Not on file     Family History: The patient's family history includes CAD in his mother; Heart attack in his father and mother; Heart disease in his father; Hypertension in his mother. ROS:   Please see the history of present illness.    All other systems reviewed and are negative.  EKGs/Labs/Other Studies Reviewed:    The following studies were reviewed today:    Recent Labs: 02/28/2021: ALT 22; Hemoglobin 16.0; Platelets 184; TSH 5.856 03/01/2021: BUN 12; Creatinine, Ser 1.18; Potassium 3.6; Sodium 139  Recent Lipid Panel    Component Value Date/Time   CHOL 192 02/28/2021 2305   CHOL 156 02/11/2021 0830   TRIG 87 02/28/2021 2305   HDL 68 02/28/2021 2305   HDL 71 02/11/2021 0830   CHOLHDL 2.8 02/28/2021 2305   VLDL 17 02/28/2021  2305   LDLCALC 107 (H) 02/28/2021 2305   LDLCALC 70 02/11/2021 0830    Physical Exam:    VS:  BP 114/64    Pulse 84    Ht 5\' 11"  (1.803 m)    Wt 160 lb 6.4 oz (72.8 kg)    SpO2 95%    BMI 22.37 kg/m     Wt Readings from Last 3 Encounters:  09/03/21 160 lb 6.4 oz (72.8 kg)  03/03/21 158 lb 1.3 oz (71.7 kg)  03/01/21 155 lb 14.4 oz (70.7 kg)     GEN: Keith Watson is beginning to look frail well nourished, well developed in no acute distress HEENT: Normal NECK: No JVD; No carotid bruits LYMPHATICS: No lymphadenopathy CARDIAC: RRR, no murmurs, rubs, gallops RESPIRATORY:  Clear to auscultation without rales, wheezing or rhonchi  ABDOMEN: Soft, non-tender, non-distended MUSCULOSKELETAL:  No edema; No deformity  SKIN: Warm and dry NEUROLOGIC:  Alert and oriented x 3 PSYCHIATRIC:  Normal affect    Signed, Shirlee More, MD  09/03/2021 11:18 AM    [place]

## 2021-09-03 NOTE — Patient Instructions (Signed)
Medication Instructions:  Your physician recommends that you continue on your current medications as directed. Please refer to the Current Medication list given to you today.  *If you need a refill on your cardiac medications before your next appointment, please call your pharmacy*   Lab Work: None If you have labs (blood work) drawn today and your tests are completely normal, you will receive your results only by: [place] (if you have MyChart) OR A paper copy in the mail If you have any lab test that is abnormal or we need to change your treatment, we will call you to review the results.   Testing/Procedures: We have placed the order for you to have a CT of your chest and abdomen. Once authorized with your insurance they will call you to schedule this appointment.    Follow-Up: At [facility], you and your health needs are our priority.  As part of our continuing mission to provide you with exceptional heart care, we have created designated Provider Care Teams.  These Care Teams include your primary Cardiologist (physician) and Advanced Practice Providers (APPs -  Physician Assistants and Nurse Practitioners) who all work together to provide you with the care you need, when you need it.  We recommend signing up for the patient portal called "MyChart".  Sign up information is provided on this After Visit Summary.  MyChart is used to connect with patients for Virtual Visits (Telemedicine).  Patients are able to view lab/test results, encounter notes, upcoming appointments, etc.  Non-urgent messages can be sent to your provider as well.   To learn more about what you can do with MyChart, go to [email or web address].    Your next appointment:   6 month(s)  The format for your next appointment:   In Person  Provider:   Shirlee More, MD    Other Instructions

## 2021-09-05 ENCOUNTER — Telehealth (HOSPITAL_BASED_OUTPATIENT_CLINIC_OR_DEPARTMENT_OTHER): Payer: Self-pay

## 2021-09-21 DIAGNOSIS — D519 Vitamin B12 deficiency anemia, unspecified: Secondary | ICD-10-CM | POA: Diagnosis not present

## 2021-09-24 ENCOUNTER — Encounter: Payer: Medicare PPO | Admitting: Vascular Surgery

## 2021-10-02 DIAGNOSIS — E039 Hypothyroidism, unspecified: Secondary | ICD-10-CM | POA: Diagnosis not present

## 2021-10-23 DIAGNOSIS — E538 Deficiency of other specified B group vitamins: Secondary | ICD-10-CM | POA: Diagnosis not present

## 2021-11-24 DIAGNOSIS — E538 Deficiency of other specified B group vitamins: Secondary | ICD-10-CM | POA: Diagnosis not present

## 2021-12-01 DIAGNOSIS — E785 Hyperlipidemia, unspecified: Secondary | ICD-10-CM | POA: Diagnosis not present

## 2021-12-01 DIAGNOSIS — I2510 Atherosclerotic heart disease of native coronary artery without angina pectoris: Secondary | ICD-10-CM | POA: Diagnosis not present

## 2021-12-01 DIAGNOSIS — E039 Hypothyroidism, unspecified: Secondary | ICD-10-CM | POA: Diagnosis not present

## 2021-12-01 DIAGNOSIS — G4700 Insomnia, unspecified: Secondary | ICD-10-CM | POA: Diagnosis not present

## 2021-12-01 DIAGNOSIS — R7301 Impaired fasting glucose: Secondary | ICD-10-CM | POA: Diagnosis not present

## 2021-12-01 DIAGNOSIS — D519 Vitamin B12 deficiency anemia, unspecified: Secondary | ICD-10-CM | POA: Diagnosis not present

## 2021-12-01 DIAGNOSIS — I10 Essential (primary) hypertension: Secondary | ICD-10-CM | POA: Diagnosis not present

## 2021-12-02 DIAGNOSIS — E785 Hyperlipidemia, unspecified: Secondary | ICD-10-CM | POA: Diagnosis not present

## 2021-12-02 DIAGNOSIS — D519 Vitamin B12 deficiency anemia, unspecified: Secondary | ICD-10-CM | POA: Diagnosis not present

## 2021-12-02 DIAGNOSIS — E039 Hypothyroidism, unspecified: Secondary | ICD-10-CM | POA: Diagnosis not present

## 2021-12-02 DIAGNOSIS — R7301 Impaired fasting glucose: Secondary | ICD-10-CM | POA: Diagnosis not present

## 2021-12-24 DIAGNOSIS — D519 Vitamin B12 deficiency anemia, unspecified: Secondary | ICD-10-CM | POA: Diagnosis not present

## 2022-01-26 DIAGNOSIS — D519 Vitamin B12 deficiency anemia, unspecified: Secondary | ICD-10-CM | POA: Diagnosis not present

## 2022-03-02 DIAGNOSIS — D519 Vitamin B12 deficiency anemia, unspecified: Secondary | ICD-10-CM | POA: Diagnosis not present

## 2022-03-04 NOTE — Progress Notes (Signed)
Cardiology Office Note:    Date:  03/05/2022   ID:  Keith Client., DOB 16-Sep-1933, MRN [id number]  PCP:  Nicoletta Dress, MD  Cardiologist:  Shirlee More, MD    Referring MD: Nicoletta Dress, MD    ASSESSMENT:    1. Coronary artery disease involving coronary bypass graft of native heart without angina pectoris   2. Thoracic aortic aneurysm without rupture, unspecified part ([place])   3. Abdominal aortic aneurysm (AAA) without rupture, unspecified part ([place])   4. Essential hypertension   5. Mixed hyperlipidemia    PLAN:    In order of problems listed above:  Stable CAD following CABG having no angina on current medical therapy continue aspirin antihypertensives and lipid-lowering Recheck CT chest abdomen noncontrast for progression of his aneurysm He is having hypotension may be contributing to a fall he had at home and reduce his lisinopril and continue to trend home blood pressures Continue his high intensity statin with CAD its been effective and recheck a lipid profile and CMP today   Next appointment: 6 months   Medication Adjustments/Labs and Tests Ordered: Current medicines are reviewed at length with the patient today.  Concerns regarding medicines are outlined above.  No orders of the defined types were placed in this encounter.  No orders of the defined types were placed in this encounter.   Chief Complaint  Patient presents with   Follow-up   Coronary Artery Disease    He also has both thoracic and abdominal aortic aneurysm.    History of Present Illness:    Keith Watson. is a 88 y.o. male with a hx of CAD CABG 2011 stroke 2020 hypertension dyslipidemia coronary artery disease left internal carotid artery 50 to 69% stenosis and diffuse arterial disease with thoracic and abdominal aortic aneurysm evaluated and being followed by CT surgery last seen 09/03/2021.  He was admitted to [facility] 02/28/2021 presenting with chest  discomfort normal high-sensitivity troponins and a CT scan showed thoracic and abdominal aortic aneurysm and was seen by vascular surgery recommended conservative management.  His blood pressure was poorly controlled and medications were adjusted.   CTA reviewed there was no acute aortic dissection or injury.  There is aneurysmal dilatation of the aortic arch and proximal mid thoracic aorta maximum diameter 5.2 cm distal arch measures 4.2 cm.  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  Impression vascular surgery was that the thoracic aortic disease was not large enough to consider repair and he would have a follow-up image performed in 6 months.  He also had a 4.4 cm infrarenal abdominal aortic rhythm.  He had bilateral renal artery disease with 60% stenosis of the origin of the left renal artery and 90 and 70% stenosis of the right renal arteries.  Compliance with diet, lifestyle and medications: Yes  His son is present participates in evaluation decision making His father had 1 fall at home without trauma He has had several episodes of blood pressure in the 90s or less intermittently middle of the day he has lost a significant of weight and will reduce the dose of his lisinopril. He has not had syncope no chest pain edema shortness of breath or palpitation He continues taking his high  intensity statin and will check the lab today including lipid profile Past Medical History:  Diagnosis Date   Adult hypothyroidism    Benign essential hypertension    CAD (coronary artery disease) 01/08/2019   Cancer ([place]) 01/08/2019   Of the Left submandibular gland   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HCC)    Chronic GERD    Controlled insomnia    Elevated fasting  blood sugar    Erectile disorder due to medical condition in male    Hyperlipidemia 01/08/2019   Hypertension 01/08/2019   Multi-vessel coronary artery stenosis    Olecranon bursitis of left elbow    PVD (peripheral vascular disease) ([place])    Thyroid disease 01/08/2019   TIA (transient ischemic attack)     Past Surgical History:  Procedure Laterality Date   CORONARY ARTERY BYPASS GRAFT  2011   ELBOW SURGERY  10/2017   Bursitis   SALIVARY GLAND SURGERY  2016   Removal due to cancer   TONSILLECTOMY  1957    Current Medications: Current Meds  Medication Sig   aspirin EC 81 MG tablet Take 81 mg by mouth daily.   atorvastatin (LIPITOR) 80 MG tablet Take 80 mg by mouth at bedtime.   cloNIDine (CATAPRES) 0.1 MG tablet Take 1 tablet (0.1 mg total) by mouth at bedtime as needed (Blood pressure greater than 180/100).   clopidogrel (PLAVIX) 75 MG tablet Take 75 mg by mouth daily.   cyanocobalamin (,VITAMIN B-12,) 1000 MCG/ML injection Inject 1,000 mcg into the muscle every 30 (thirty) days.   ezetimibe (ZETIA) 10 MG tablet Take 10 mg by mouth daily.   levothyroxine (SYNTHROID) 150 MCG tablet Take 150 mcg by mouth daily before breakfast.   lisinopril (ZESTRIL) 30 MG tablet Take 30 mg by mouth at bedtime.   meclizine (ANTIVERT) 25 MG tablet Take 25 mg by mouth 3 (three) times daily as needed for dizziness.   mirabegron ER (MYRBETRIQ) 50 MG TB24 tablet Take 50 mg by mouth daily.   nitroGLYCERIN (NITROSTAT) 0.4 MG SL tablet Place 0.4 mg under the tongue every 5 (five) minutes as needed for chest pain.   omeprazole (PRILOSEC) 20 MG capsule Take 20 mg by mouth daily.   traZODone (DESYREL) 100 MG tablet Take 100 mg by mouth at bedtime.     Allergies:   Patient has no known allergies.   Social History   Socioeconomic History   Marital status: Married    Spouse name: Not on file   Number of children: Not on file   Years of education: Not on file   Highest education level: Not on file   Occupational History   Occupation: Physiological scientist    Comment: Retired  Tobacco Use   Smoking status: Former    Packs/day: 1.00    Years: 10.00    Total pack years: 10.00    Types: Cigarettes    Quit date: 1970    Years since quitting: 53.6   Smokeless tobacco: Never   Tobacco comments:    quit 45 years  Vaping Use   Vaping Use: Never used  Substance and Sexual Activity   Alcohol use: Yes    Alcohol/week: 1.0 - 2.0 standard drink of alcohol    Types: 1 - 2 Standard drinks or equivalent per week    Comment: 1-2 cocktails daily   Drug use: Never   Sexual activity: Not on file  Other Topics Concern   Not on file  Social History Narrative   Not on  file   Social Determinants of Health   Financial Resource Strain: Not on file  Food Insecurity: Not on file  Transportation Needs: Not on file  Physical Activity: Not on file  Stress: Not on file  Social Connections: Not on file     Family History: The patient's family history includes CAD in his mother; Heart attack in his father and mother; Heart disease in his father; Hypertension in his mother. ROS:   Please see the history of present illness.    All other systems reviewed and are negative.  EKGs/Labs/Other Studies Reviewed:    The following studies were reviewed today:  EKG:  EKG ordered today and personally reviewed.  The ekg ordered today demonstrates sinus rhythm right bundle branch block single PVC  Recent Labs: No results found for requested labs within last 365 days.  Recent Lipid Panel    Component Value Date/Time   CHOL 192 02/28/2021 2305   CHOL 156 02/11/2021 0830   TRIG 87 02/28/2021 2305   HDL 68 02/28/2021 2305   HDL 71 02/11/2021 0830   CHOLHDL 2.8 02/28/2021 2305   VLDL 17 02/28/2021 2305   LDLCALC 107 (H) 02/28/2021 2305   LDLCALC 70 02/11/2021 0830    Physical Exam:    VS:  BP 104/80 (BP Location: Left Arm, Patient Position: Sitting, Cuff Size: Normal)   Pulse (!) 110   Ht 5'  11" (1.803 m)   Wt 156 lb (70.8 kg)   SpO2 96%   BMI 21.76 kg/m     Wt Readings from Last 3 Encounters:  03/05/22 156 lb (70.8 kg)  09/03/21 160 lb 6.4 oz (72.8 kg)  03/03/21 158 lb 1.3 oz (71.7 kg)     GEN:  Well nourished, well developed in no acute distress HEENT: Normal NECK: No JVD; No carotid bruits LYMPHATICS: No lymphadenopathy CARDIAC: RRR, no murmurs, rubs, gallops RESPIRATORY:  Clear to auscultation without rales, wheezing or rhonchi  ABDOMEN: Soft, non-tender, non-distended MUSCULOSKELETAL:  No edema; No deformity  SKIN: Warm and dry NEUROLOGIC:  Alert and oriented x 3 PSYCHIATRIC:  Normal affect    Signed, Shirlee More, MD  03/05/2022 10:21 AM    [place]

## 2022-03-05 ENCOUNTER — Encounter: Payer: Self-pay | Admitting: Cardiology

## 2022-03-05 ENCOUNTER — Ambulatory Visit: Payer: Medicare Other | Admitting: Cardiology

## 2022-03-05 VITALS — BP 104/80 | HR 110 | Ht 71.0 in | Wt 156.0 lb

## 2022-03-05 DIAGNOSIS — I712 Thoracic aortic aneurysm, without rupture, unspecified: Secondary | ICD-10-CM

## 2022-03-05 DIAGNOSIS — I25810 Atherosclerosis of coronary artery bypass graft(s) without angina pectoris: Secondary | ICD-10-CM

## 2022-03-05 DIAGNOSIS — E782 Mixed hyperlipidemia: Secondary | ICD-10-CM | POA: Diagnosis not present

## 2022-03-05 DIAGNOSIS — I10 Essential (primary) hypertension: Secondary | ICD-10-CM | POA: Diagnosis not present

## 2022-03-05 DIAGNOSIS — I714 Abdominal aortic aneurysm, without rupture, unspecified: Secondary | ICD-10-CM

## 2022-03-05 MED ORDER — LISINOPRIL 10 MG PO TABS: 10.0000 mg | ORAL_TABLET | Freq: Every day | ORAL | 3 refills | Status: DC

## 2022-03-05 NOTE — Addendum Note (Signed)
Addended by: Edwyna Shell I on: 03/05/2022 10:48 AM   Modules accepted: Orders

## 2022-03-05 NOTE — Patient Instructions (Signed)
Medication Instructions:  Your physician has recommended you make the following change in your medication:   START: Lisinopril 10 mg daily  *If you need a refill on your cardiac medications before your next appointment, please call your pharmacy*   Lab Work: Your physician recommends that you return for lab work in:   Labs today: CMP, Lipid  If you have labs (blood work) drawn today and your tests are completely normal, you will receive your results only by: [place] (if you have [place]) OR A paper copy in the mail If you have any lab test that is abnormal or we need to change your treatment, we will call you to review the results.   Testing/Procedures:  CT Chest/Abdomin and Pelvis without contrast    Follow-Up: At [facility], you and your health needs are our priority.  As part of our continuing mission to provide you with exceptional heart care, we have created designated Provider Care Teams.  These Care Teams include your primary Cardiologist (physician) and Advanced Practice Providers (APPs -  Physician Assistants and Nurse Practitioners) who all work together to provide you with the care you need, when you need it.  We recommend signing up for the patient portal called "MyChart".  Sign up information is provided on this After Visit Summary.  MyChart is used to connect with patients for Virtual Visits (Telemedicine).  Patients are able to view lab/test results, encounter notes, upcoming appointments, etc.  Non-urgent messages can be sent to your provider as well.   To learn more about what you can do with MyChart, go to [email or web address].    Your next appointment:   6 month(s)  The format for your next appointment:   In Person  Provider:   Shirlee More, MD    Other Instructions None  Important Information About Sugar

## 2022-03-06 LAB — COMPREHENSIVE METABOLIC PANEL
ALT: 11 IU/L (ref 0–44)
AST: 16 IU/L (ref 0–40)
Albumin/Globulin Ratio: 2 (ref 1.2–2.2)
Albumin: 4.7 g/dL (ref 3.7–4.7)
Alkaline Phosphatase: 107 IU/L (ref 44–121)
BUN/Creatinine Ratio: 13 (ref 10–24)
BUN: 17 mg/dL (ref 8–27)
Bilirubin Total: 0.9 mg/dL (ref 0.0–1.2)
CO2: 24 mmol/L (ref 20–29)
Calcium: 9.5 mg/dL (ref 8.6–10.2)
Chloride: 101 mmol/L (ref 96–106)
Creatinine, Ser: 1.33 mg/dL — ABNORMAL HIGH (ref 0.76–1.27)
Globulin, Total: 2.3 g/dL (ref 1.5–4.5)
Glucose: 84 mg/dL (ref 70–99)
Potassium: 4.5 mmol/L (ref 3.5–5.2)
Sodium: 141 mmol/L (ref 134–144)
Total Protein: 7 g/dL (ref 6.0–8.5)
eGFR: 52 mL/min/{1.73_m2} — ABNORMAL LOW (ref >59)

## 2022-03-06 LAB — LIPID PANEL
Chol/HDL Ratio: 2.2 ratio (ref 0.0–5.0)
Cholesterol, Total: 142 mg/dL (ref 100–199)
HDL: 64 mg/dL (ref >39)
LDL Chol Calc (NIH): 64 mg/dL (ref 0–99)
Triglycerides: 72 mg/dL (ref 0–149)
VLDL Cholesterol Cal: 14 mg/dL (ref 5–40)

## 2022-03-08 ENCOUNTER — Other Ambulatory Visit: Payer: Self-pay

## 2022-03-08 MED ORDER — LISINOPRIL 10 MG PO TABS: 10.0000 mg | ORAL_TABLET | Freq: Every day | ORAL | 3 refills | Status: DC

## 2022-03-09 ENCOUNTER — Telehealth: Payer: Self-pay | Admitting: Cardiology

## 2022-03-09 MED ORDER — CLOPIDOGREL BISULFATE 75 MG PO TABS: 75.0000 mg | ORAL_TABLET | Freq: Every day | ORAL | 0 refills | Status: DC

## 2022-03-09 NOTE — Telephone Encounter (Signed)
*  STAT* If patient is at the pharmacy, call can be transferred to refill team.   1. Which medications need to be refilled? (please list name of each medication and dose if known)   clopidogrel (PLAVIX) 75 MG tablet    2. Which pharmacy/location (including street and city if local pharmacy) is medication to be sent to? [place]  3. Do they need a 30 day or 90 day supply?  90 day

## 2022-03-23 ENCOUNTER — Ambulatory Visit (HOSPITAL_COMMUNITY)
Admission: RE | Admit: 2022-03-23 | Discharge: 2022-03-23 | Disposition: A | Discharge status: Home / Self Care | Payer: Medicare Other | Source: Ambulatory Visit | Attending: Cardiology | Admitting: Cardiology

## 2022-03-23 DIAGNOSIS — J984 Other disorders of lung: Secondary | ICD-10-CM | POA: Diagnosis not present

## 2022-03-23 DIAGNOSIS — I714 Abdominal aortic aneurysm, without rupture, unspecified: Secondary | ICD-10-CM | POA: Insufficient documentation

## 2022-03-23 DIAGNOSIS — I25810 Atherosclerosis of coronary artery bypass graft(s) without angina pectoris: Secondary | ICD-10-CM | POA: Insufficient documentation

## 2022-03-23 DIAGNOSIS — I10 Essential (primary) hypertension: Secondary | ICD-10-CM | POA: Diagnosis not present

## 2022-03-23 DIAGNOSIS — I712 Thoracic aortic aneurysm, without rupture, unspecified: Secondary | ICD-10-CM | POA: Insufficient documentation

## 2022-03-23 DIAGNOSIS — E782 Mixed hyperlipidemia: Secondary | ICD-10-CM | POA: Diagnosis not present

## 2022-03-23 DIAGNOSIS — K5730 Diverticulosis of large intestine without perforation or abscess without bleeding: Secondary | ICD-10-CM | POA: Diagnosis not present

## 2022-03-23 DIAGNOSIS — K8020 Calculus of gallbladder without cholecystitis without obstruction: Secondary | ICD-10-CM | POA: Diagnosis not present

## 2022-04-06 DIAGNOSIS — D519 Vitamin B12 deficiency anemia, unspecified: Secondary | ICD-10-CM | POA: Diagnosis not present

## 2022-05-07 DIAGNOSIS — D519 Vitamin B12 deficiency anemia, unspecified: Secondary | ICD-10-CM | POA: Diagnosis not present

## 2022-05-07 DIAGNOSIS — Z23 Encounter for immunization: Secondary | ICD-10-CM | POA: Diagnosis not present

## 2022-05-07 DIAGNOSIS — Z0000 Encounter for general adult medical examination without abnormal findings: Secondary | ICD-10-CM | POA: Diagnosis not present

## 2022-05-07 DIAGNOSIS — E785 Hyperlipidemia, unspecified: Secondary | ICD-10-CM | POA: Diagnosis not present

## 2022-05-07 DIAGNOSIS — Z9181 History of falling: Secondary | ICD-10-CM | POA: Diagnosis not present

## 2022-06-09 DIAGNOSIS — D519 Vitamin B12 deficiency anemia, unspecified: Secondary | ICD-10-CM | POA: Diagnosis not present

## 2022-06-17 DIAGNOSIS — H524 Presbyopia: Secondary | ICD-10-CM | POA: Diagnosis not present

## 2022-06-17 DIAGNOSIS — H25813 Combined forms of age-related cataract, bilateral: Secondary | ICD-10-CM | POA: Diagnosis not present

## 2022-06-23 DIAGNOSIS — R404 Transient alteration of awareness: Secondary | ICD-10-CM | POA: Diagnosis not present

## 2022-06-23 DIAGNOSIS — Z951 Presence of aortocoronary bypass graft: Secondary | ICD-10-CM | POA: Diagnosis not present

## 2022-06-23 DIAGNOSIS — Z8673 Personal history of transient ischemic attack (TIA), and cerebral infarction without residual deficits: Secondary | ICD-10-CM | POA: Diagnosis not present

## 2022-06-23 DIAGNOSIS — R202 Paresthesia of skin: Secondary | ICD-10-CM | POA: Diagnosis not present

## 2022-06-23 DIAGNOSIS — Z7902 Long term (current) use of antithrombotics/antiplatelets: Secondary | ICD-10-CM | POA: Diagnosis not present

## 2022-06-23 DIAGNOSIS — G459 Transient cerebral ischemic attack, unspecified: Secondary | ICD-10-CM | POA: Diagnosis not present

## 2022-06-23 DIAGNOSIS — Z7982 Long term (current) use of aspirin: Secondary | ICD-10-CM | POA: Diagnosis not present

## 2022-06-23 DIAGNOSIS — E039 Hypothyroidism, unspecified: Secondary | ICD-10-CM | POA: Diagnosis not present

## 2022-06-23 DIAGNOSIS — E876 Hypokalemia: Secondary | ICD-10-CM | POA: Diagnosis not present

## 2022-06-23 DIAGNOSIS — E785 Hyperlipidemia, unspecified: Secondary | ICD-10-CM | POA: Diagnosis not present

## 2022-06-23 DIAGNOSIS — I639 Cerebral infarction, unspecified: Secondary | ICD-10-CM | POA: Diagnosis not present

## 2022-06-23 DIAGNOSIS — I2510 Atherosclerotic heart disease of native coronary artery without angina pectoris: Secondary | ICD-10-CM | POA: Diagnosis not present

## 2022-06-23 DIAGNOSIS — R4781 Slurred speech: Secondary | ICD-10-CM | POA: Diagnosis not present

## 2022-06-23 DIAGNOSIS — Z79899 Other long term (current) drug therapy: Secondary | ICD-10-CM | POA: Diagnosis not present

## 2022-06-23 DIAGNOSIS — I252 Old myocardial infarction: Secondary | ICD-10-CM | POA: Diagnosis not present

## 2022-06-23 DIAGNOSIS — I491 Atrial premature depolarization: Secondary | ICD-10-CM | POA: Diagnosis not present

## 2022-06-23 DIAGNOSIS — R29701 NIHSS score 1: Secondary | ICD-10-CM | POA: Diagnosis not present

## 2022-06-23 DIAGNOSIS — G319 Degenerative disease of nervous system, unspecified: Secondary | ICD-10-CM | POA: Diagnosis not present

## 2022-06-23 DIAGNOSIS — I10 Essential (primary) hypertension: Secondary | ICD-10-CM | POA: Diagnosis not present

## 2022-06-23 DIAGNOSIS — R0689 Other abnormalities of breathing: Secondary | ICD-10-CM | POA: Diagnosis not present

## 2022-06-23 DIAGNOSIS — K219 Gastro-esophageal reflux disease without esophagitis: Secondary | ICD-10-CM | POA: Diagnosis not present

## 2022-06-24 DIAGNOSIS — I361 Nonrheumatic tricuspid (valve) insufficiency: Secondary | ICD-10-CM | POA: Diagnosis not present

## 2022-06-24 DIAGNOSIS — I351 Nonrheumatic aortic (valve) insufficiency: Secondary | ICD-10-CM

## 2022-07-08 DIAGNOSIS — E039 Hypothyroidism, unspecified: Secondary | ICD-10-CM | POA: Diagnosis not present

## 2022-07-08 DIAGNOSIS — I639 Cerebral infarction, unspecified: Secondary | ICD-10-CM | POA: Diagnosis not present

## 2022-07-08 DIAGNOSIS — G459 Transient cerebral ischemic attack, unspecified: Secondary | ICD-10-CM | POA: Diagnosis not present

## 2022-07-08 DIAGNOSIS — G4700 Insomnia, unspecified: Secondary | ICD-10-CM | POA: Diagnosis not present

## 2022-07-08 DIAGNOSIS — D519 Vitamin B12 deficiency anemia, unspecified: Secondary | ICD-10-CM | POA: Diagnosis not present

## 2022-07-08 DIAGNOSIS — I10 Essential (primary) hypertension: Secondary | ICD-10-CM | POA: Diagnosis not present

## 2022-07-08 DIAGNOSIS — R7301 Impaired fasting glucose: Secondary | ICD-10-CM | POA: Diagnosis not present

## 2022-07-08 DIAGNOSIS — E785 Hyperlipidemia, unspecified: Secondary | ICD-10-CM | POA: Diagnosis not present

## 2022-07-08 DIAGNOSIS — I2510 Atherosclerotic heart disease of native coronary artery without angina pectoris: Secondary | ICD-10-CM | POA: Diagnosis not present

## 2022-07-22 DIAGNOSIS — Z8673 Personal history of transient ischemic attack (TIA), and cerebral infarction without residual deficits: Secondary | ICD-10-CM | POA: Diagnosis not present

## 2022-08-10 DIAGNOSIS — D519 Vitamin B12 deficiency anemia, unspecified: Secondary | ICD-10-CM | POA: Diagnosis not present

## 2022-09-08 DIAGNOSIS — J208 Acute bronchitis due to other specified organisms: Secondary | ICD-10-CM | POA: Diagnosis not present

## 2022-11-01 IMAGING — CR DG CHEST 1V
1 series · 1 of 1 positions shown · non-contrast
Comparison: June 19, 2010

CLINICAL DATA: Patient woke up feeling anxious and shaky this
morning. No pain. EKG changes.

EXAM:
CHEST  1 VIEW

[chest pa]
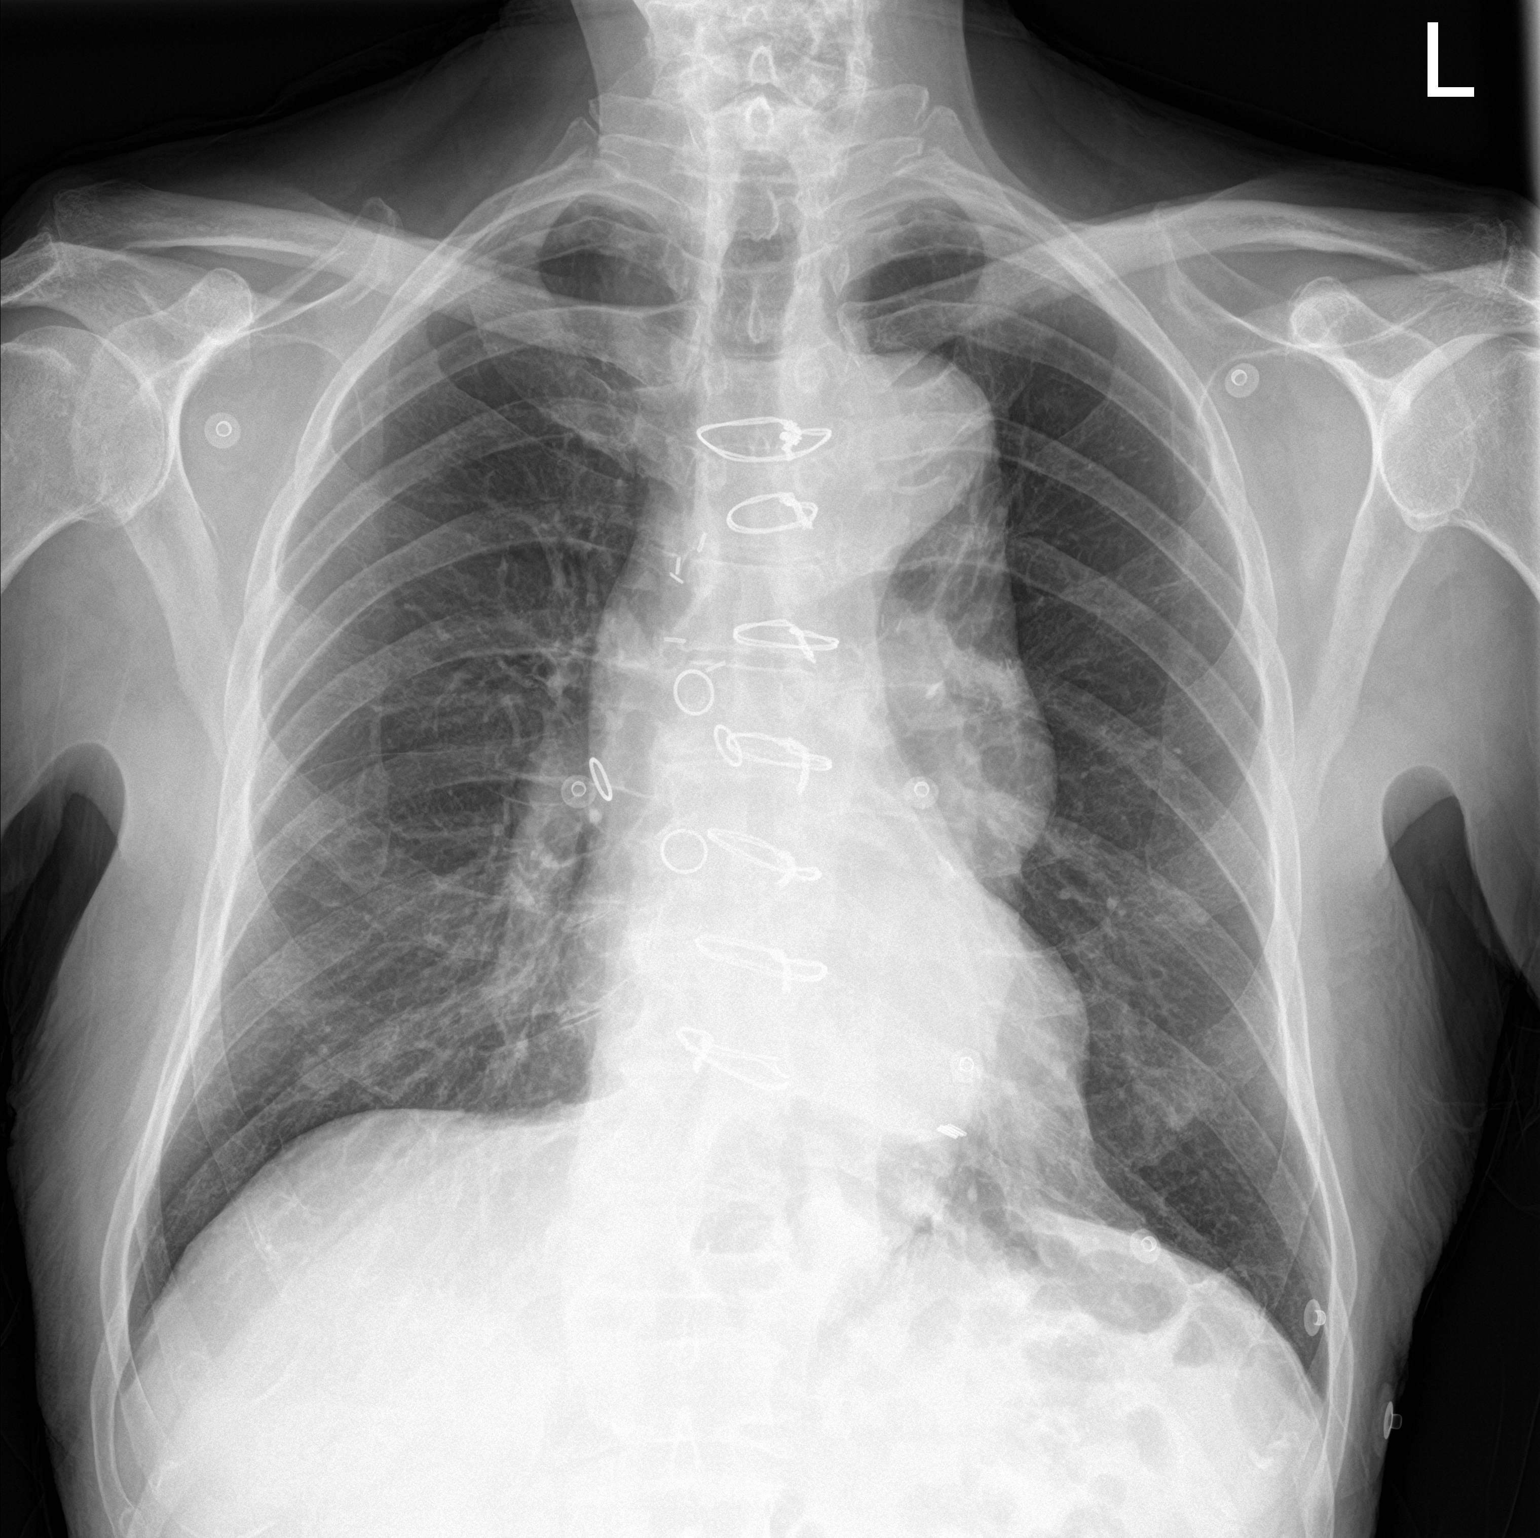

[1 of 1 positions shown; findings below may reference images not displayed]

FINDINGS: There is a convexity to the descending thoracic aorta not seen on
the comparison study. The remainder of the thoracic aorta is
tortuous but unchanged. The hila and mediastinum are unremarkable.
The heart size is normal. No pneumothorax. No suspicious nodules or
masses. No focal infiltrates.
IMPRESSION: 1. There is a new convexity to the descending thoracic aorta raising
the possibility of aneurysm/aortic pathology. Recommend a CT
angiogram of the chest to better evaluate the aorta.
2. No other abnormalities.

## 2022-12-16 DIAGNOSIS — Z8673 Personal history of transient ischemic attack (TIA), and cerebral infarction without residual deficits: Secondary | ICD-10-CM | POA: Diagnosis not present

## 2022-12-16 DIAGNOSIS — R499 Unspecified voice and resonance disorder: Secondary | ICD-10-CM | POA: Diagnosis not present

## 2022-12-16 DIAGNOSIS — R634 Abnormal weight loss: Secondary | ICD-10-CM | POA: Diagnosis not present

## 2022-12-16 DIAGNOSIS — E785 Hyperlipidemia, unspecified: Secondary | ICD-10-CM | POA: Diagnosis not present

## 2022-12-16 DIAGNOSIS — R7301 Impaired fasting glucose: Secondary | ICD-10-CM | POA: Diagnosis not present

## 2022-12-17 DIAGNOSIS — I712 Thoracic aortic aneurysm, without rupture, unspecified: Secondary | ICD-10-CM | POA: Diagnosis not present

## 2022-12-17 DIAGNOSIS — D519 Vitamin B12 deficiency anemia, unspecified: Secondary | ICD-10-CM | POA: Diagnosis not present

## 2022-12-17 DIAGNOSIS — E785 Hyperlipidemia, unspecified: Secondary | ICD-10-CM | POA: Diagnosis not present

## 2022-12-17 DIAGNOSIS — Z951 Presence of aortocoronary bypass graft: Secondary | ICD-10-CM | POA: Diagnosis not present

## 2022-12-17 DIAGNOSIS — R7301 Impaired fasting glucose: Secondary | ICD-10-CM | POA: Diagnosis not present

## 2022-12-17 DIAGNOSIS — E039 Hypothyroidism, unspecified: Secondary | ICD-10-CM | POA: Diagnosis not present

## 2022-12-17 DIAGNOSIS — R0602 Shortness of breath: Secondary | ICD-10-CM | POA: Diagnosis not present

## 2022-12-17 DIAGNOSIS — R634 Abnormal weight loss: Secondary | ICD-10-CM | POA: Diagnosis not present

## 2022-12-23 DIAGNOSIS — D519 Vitamin B12 deficiency anemia, unspecified: Secondary | ICD-10-CM | POA: Diagnosis not present

## 2022-12-29 ENCOUNTER — Ambulatory Visit: Payer: Medicare Other | Attending: Cardiology | Admitting: Cardiology

## 2022-12-29 ENCOUNTER — Encounter: Payer: Self-pay | Admitting: Cardiology

## 2022-12-29 VITALS — BP 118/76 | HR 97 | Ht 71.0 in | Wt 148.0 lb

## 2022-12-29 DIAGNOSIS — E782 Mixed hyperlipidemia: Secondary | ICD-10-CM | POA: Diagnosis not present

## 2022-12-29 DIAGNOSIS — I25810 Atherosclerosis of coronary artery bypass graft(s) without angina pectoris: Secondary | ICD-10-CM

## 2022-12-29 DIAGNOSIS — I712 Thoracic aortic aneurysm, without rupture, unspecified: Secondary | ICD-10-CM

## 2022-12-29 DIAGNOSIS — I714 Abdominal aortic aneurysm, without rupture, unspecified: Secondary | ICD-10-CM | POA: Diagnosis not present

## 2022-12-29 DIAGNOSIS — I10 Essential (primary) hypertension: Secondary | ICD-10-CM

## 2022-12-29 NOTE — Progress Notes (Signed)
Cardiology Office Note:    Date:  12/29/2022   ID:  Keith Lamb., DOB Jan 23, 1934, MRN [id number]  PCP:  Keith Fusi, MD  Cardiologist:  Norman Herrlich, MD    Referring MD: Keith Fusi, MD    ASSESSMENT:    1. Thoracic aortic aneurysm without rupture, unspecified part (HCC)   2. Abdominal aortic aneurysm (AAA) without rupture, unspecified part (HCC)   3. Coronary artery disease involving coronary bypass graft of native heart without angina pectoris   4. Essential hypertension   5. Mixed hyperlipidemia    PLAN:    In order of problems listed above:  Keith Watson overall is doing well he will require a follow-up CT scan to continue to follow his thoracic and abdominal aortopathy plan for August without contrast. Stable CAD having no anginal discomfort continue medical therapy including aspirin and atorvastatin and clopidogrel long-term Hypertension is controlled continue his clonidine lisinopril Continue lipid-lowering therapy including statin plus Zetia   Next appointment: 9 months   Medication Adjustments/Labs and Tests Ordered: Current medicines are reviewed at length with the patient today.  Concerns regarding medicines are outlined above.  Orders Placed This Encounter  Procedures   CT CHEST ABDOMEN PELVIS WO CONTRAST   No orders of the defined types were placed in this encounter.   Chief Complaint  Patient presents with   Follow-up    History of Present Illness:    Keith Bodart. is a 89 y.o. male with a hx of CAD with CABG in 2011 stroke 2020 hypertension dyslipidemia carotid artery stenosis 50 to 69% left side and diffuse aortic disease with thoracic and abdominal aortic aneurysm evaluated to be followed by CT surgery last seen 03/05/2022.  CT examination 03/23/2022 showed similar appearance of multiple areas of aneurysmal dilation of both the thoracic and abdominal aorta, ascending aorta 3.7 cm distal thoracic aorta 3.6 cm proximal  descending thoracic aorta 4.1 cm mid descending thoracic aorta 5.3 x 5.8 cm. Compliance with diet, lifestyle and medications: Yes  Overall he is doing well he remains living in the home they have a caregiver come in the morning and his son is present today supervises his care He has had no edema shortness of breath chest pain palpitation or syncope He relates recent labs in the last week and his PCP office I do not have those results. 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HCC)    Chronic GERD    Controlled insomnia    Elevated fasting blood sugar    Erectile disorder due to medical condition in male    Hyperlipidemia 01/08/2019   Hypertension 01/08/2019   Multi-vessel coronary artery stenosis    Olecranon bursitis of left elbow    PVD (peripheral vascular disease) (HCC)    Thyroid disease 01/08/2019   TIA (transient ischemic attack)     Past Surgical History:  Procedure Laterality Date   CORONARY ARTERY BYPASS GRAFT  2011   ELBOW SURGERY  10/2017   Bursitis   SALIVARY GLAND SURGERY  2016   Removal due to cancer   TONSILLECTOMY  1957    Current Medications: Current Meds  Medication Sig   aspirin EC 81 MG tablet Take 81 mg by mouth daily.   atorvastatin (LIPITOR) 80 MG tablet Take 80  mg by mouth at bedtime.   cloNIDine (CATAPRES) 0.1 MG tablet Take 1 tablet (0.1 mg total) by mouth at bedtime as needed (Blood pressure greater than 180/100).   clopidogrel (PLAVIX) 75 MG tablet Take 1 tablet (75 mg total) by mouth daily.   cyanocobalamin (,VITAMIN B-12,) 1000 MCG/ML injection Inject 1,000 mcg into the muscle every 30 (thirty) days.   ezetimibe (ZETIA) 10 MG tablet Take 10 mg by mouth daily.   levothyroxine (SYNTHROID) 150 MCG tablet Take 150 mcg by mouth daily before breakfast.   lisinopril  (ZESTRIL) 10 MG tablet Take 1 tablet (10 mg total) by mouth daily.   meclizine (ANTIVERT) 25 MG tablet Take 25 mg by mouth 3 (three) times daily as needed for dizziness.   mirabegron ER (MYRBETRIQ) 50 MG TB24 tablet Take 50 mg by mouth daily.   nitroGLYCERIN (NITROSTAT) 0.4 MG SL tablet Place 0.4 mg under the tongue every 5 (five) minutes as needed for chest pain.   omeprazole (PRILOSEC) 20 MG capsule Take 20 mg by mouth daily.   traZODone (DESYREL) 100 MG tablet Take 100 mg by mouth at bedtime.     Allergies:   Patient has no known allergies.   Social History   Socioeconomic History   Marital status: Married    Spouse name: Not on file   Number of children: Not on file   Years of education: Not on file   Highest education level: Not on file  Occupational History   Occupation: Freight forwarder    Comment: Retired  Tobacco Use   Smoking status: Former    Packs/day: 1.00    Years: 10.00    Additional pack years: 0.00    Total pack years: 10.00    Types: Cigarettes    Quit date: 1970    Years since quitting: 54.4   Smokeless tobacco: Never   Tobacco comments:    quit 45 years  Vaping Use   Vaping Use: Never used  Substance and Sexual Activity   Alcohol use: Yes    Alcohol/week: 1.0 - 2.0 standard drink of alcohol    Types: 1 - 2 Standard drinks or equivalent per week    Comment: 1-2 cocktails daily   Drug use: Never   Sexual activity: Not on file  Other Topics Concern   Not on file  Social History Narrative   Not on file   Social Determinants of Health   Financial Resource Strain: Not on file  Food Insecurity: Not on file  Transportation Needs: Not on file  Physical Activity: Not on file  Stress: Not on file  Social Connections: Not on file     Family History: The patient's family history includes CAD in his mother; Heart attack in his father and mother; Heart disease in his father; Hypertension in his mother. ROS:   Please see the history of present  illness.    All other systems reviewed and are negative.  EKGs/Labs/Other Studies Reviewed:    The following studies were reviewed today:  Cardiac Studies & Procedures         MONITORS  LONG TERM MONITOR (3-14 DAYS) 02/05/2019  Narrative A ZIO monitor was performed for 13 days 23 hours to assess atrial fibrillation associated with stroke beginning 01/15/2019.  The rhythm throughout is sinus with first-degree AV block with minimum average and maximum heart rates of 47, 68 and 105 bpm.  There were no pauses of 3 seconds or greater and no episodes of sinus node or AV  nodal block.  Ventricular ectopy was rare with isolated PVCs and couplets.  There were 2 runs of PVCs the longest 12 complexes at a rate of 176 bpm and the fastest 5 complexes at a rate of 185 bpm initiated with a fusion beat.  Supraventricular ectopy was rare with predominantly isolated APCs.  There were 20 brief runs of atrial premature contractions the longest 19 complexes at a rate of 126 bpm.  There were no episodes of atrial fibrillation or flutter.  There was one triggered event associated with atrial premature contractions.   Conclusion absence of atrial fibrillation in a patient with stroke             Recent Labs: 03/05/2022: ALT 11; BUN 17; Creatinine, Ser 1.33; Potassium 4.5; Sodium 141  Recent Lipid Panel    Component Value Date/Time   CHOL 142 03/05/2022 1056   TRIG 72 03/05/2022 1056   HDL 64 03/05/2022 1056   CHOLHDL 2.2 03/05/2022 1056   CHOLHDL 2.8 02/28/2021 2305   VLDL 17 02/28/2021 2305   LDLCALC 64 03/05/2022 1056    Physical Exam:    VS:  BP 118/76 (BP Location: Left Arm, Patient Position: Sitting)   Pulse 97   Ht 5\' 11"  (1.803 m)   Wt 148 lb (67.1 kg)   SpO2 98%   BMI 20.64 kg/m     Wt Readings from Last 3 Encounters:  12/29/22 148 lb (67.1 kg)  03/05/22 156 lb (70.8 kg)  09/03/21 160 lb 6.4 oz (72.8 kg)     GEN:  Well nourished, well developed in no acute distress HEENT:  Normal NECK: No JVD; No carotid bruits LYMPHATICS: No lymphadenopathy CARDIAC: RRR, no murmurs, rubs, gallops RESPIRATORY:  Clear to auscultation without rales, wheezing or rhonchi  ABDOMEN: Soft, non-tender, non-distended MUSCULOSKELETAL:  No edema; No deformity  SKIN: Warm and dry NEUROLOGIC:  Alert and oriented x 3 PSYCHIATRIC:  Normal affect    Signed, Norman Herrlich, MD  12/29/2022 9:53 AM    [facility]

## 2022-12-29 NOTE — Patient Instructions (Signed)
Medication Instructions:  Your physician recommends that you continue on your current medications as directed. Please refer to the Current Medication list given to you today.  *If you need a refill on your cardiac medications before your next appointment, please call your pharmacy*   Lab Work: None ordered If you have labs (blood work) drawn today and your tests are completely normal, you will receive your results only by: MyChart Message (if you have MyChart) OR A paper copy in the mail If you have any lab test that is abnormal or we need to change your treatment, we will call you to review the results.   Testing/Procedures: Non-Cardiac CT scanning, (CAT scanning), is a noninvasive, special x-ray that produces cross-sectional images of the body using x-rays and a computer. CT scans help physicians diagnose and treat medical conditions. For some CT exams, a contrast material is used to enhance visibility in the area of the body being studied. CT scans provide greater clarity and reveal more details than regular x-ray exams.    Follow-Up: At [facility], you and your health needs are our priority.  As part of our continuing mission to provide you with exceptional heart care, we have created designated Provider Care Teams.  These Care Teams include your primary Cardiologist (physician) and Advanced Practice Providers (APPs -  Physician Assistants and Nurse Practitioners) who all work together to provide you with the care you need, when you need it.  We recommend signing up for the patient portal called "MyChart".  Sign up information is provided on this After Visit Summary.  MyChart is used to connect with patients for Virtual Visits (Telemedicine).  Patients are able to view lab/test results, encounter notes, upcoming appointments, etc.  Non-urgent messages can be sent to your provider as well.   To learn more about what you can do with MyChart, go to [email or web address].    Your  next appointment:   6 month(s)  The format for your next appointment:   In Person  Provider:   Norman Herrlich, MD

## 2023-01-07 DIAGNOSIS — E785 Hyperlipidemia, unspecified: Secondary | ICD-10-CM | POA: Diagnosis not present

## 2023-01-07 DIAGNOSIS — E039 Hypothyroidism, unspecified: Secondary | ICD-10-CM | POA: Diagnosis not present

## 2023-01-07 DIAGNOSIS — K5909 Other constipation: Secondary | ICD-10-CM | POA: Diagnosis not present

## 2023-01-07 DIAGNOSIS — R499 Unspecified voice and resonance disorder: Secondary | ICD-10-CM | POA: Diagnosis not present

## 2023-01-07 DIAGNOSIS — D519 Vitamin B12 deficiency anemia, unspecified: Secondary | ICD-10-CM | POA: Diagnosis not present

## 2023-01-26 DIAGNOSIS — D519 Vitamin B12 deficiency anemia, unspecified: Secondary | ICD-10-CM | POA: Diagnosis not present

## 2023-02-11 DIAGNOSIS — Z87891 Personal history of nicotine dependence: Secondary | ICD-10-CM | POA: Diagnosis not present

## 2023-02-11 DIAGNOSIS — R490 Dysphonia: Secondary | ICD-10-CM | POA: Diagnosis not present

## 2023-02-11 DIAGNOSIS — J383 Other diseases of vocal cords: Secondary | ICD-10-CM | POA: Diagnosis not present

## 2023-02-25 DIAGNOSIS — I4510 Unspecified right bundle-branch block: Secondary | ICD-10-CM | POA: Diagnosis not present

## 2023-02-25 DIAGNOSIS — R9431 Abnormal electrocardiogram [ECG] [EKG]: Secondary | ICD-10-CM | POA: Diagnosis not present

## 2023-02-25 DIAGNOSIS — E7800 Pure hypercholesterolemia, unspecified: Secondary | ICD-10-CM | POA: Diagnosis not present

## 2023-02-25 DIAGNOSIS — I4891 Unspecified atrial fibrillation: Secondary | ICD-10-CM | POA: Diagnosis not present

## 2023-02-25 DIAGNOSIS — M549 Dorsalgia, unspecified: Secondary | ICD-10-CM | POA: Diagnosis not present

## 2023-02-25 DIAGNOSIS — Z7902 Long term (current) use of antithrombotics/antiplatelets: Secondary | ICD-10-CM | POA: Diagnosis not present

## 2023-02-25 DIAGNOSIS — W19XXXA Unspecified fall, initial encounter: Secondary | ICD-10-CM | POA: Diagnosis not present

## 2023-02-25 DIAGNOSIS — E039 Hypothyroidism, unspecified: Secondary | ICD-10-CM | POA: Diagnosis not present

## 2023-02-25 DIAGNOSIS — M4316 Spondylolisthesis, lumbar region: Secondary | ICD-10-CM | POA: Diagnosis not present

## 2023-02-25 DIAGNOSIS — Z951 Presence of aortocoronary bypass graft: Secondary | ICD-10-CM | POA: Diagnosis not present

## 2023-02-25 DIAGNOSIS — E785 Hyperlipidemia, unspecified: Secondary | ICD-10-CM | POA: Diagnosis not present

## 2023-02-25 DIAGNOSIS — I517 Cardiomegaly: Secondary | ICD-10-CM | POA: Diagnosis not present

## 2023-02-25 DIAGNOSIS — Z79899 Other long term (current) drug therapy: Secondary | ICD-10-CM | POA: Diagnosis not present

## 2023-02-25 DIAGNOSIS — Z7982 Long term (current) use of aspirin: Secondary | ICD-10-CM | POA: Diagnosis not present

## 2023-02-25 DIAGNOSIS — M47816 Spondylosis without myelopathy or radiculopathy, lumbar region: Secondary | ICD-10-CM | POA: Diagnosis not present

## 2023-02-25 DIAGNOSIS — I10 Essential (primary) hypertension: Secondary | ICD-10-CM | POA: Diagnosis not present

## 2023-02-25 DIAGNOSIS — I444 Left anterior fascicular block: Secondary | ICD-10-CM | POA: Diagnosis not present

## 2023-02-25 DIAGNOSIS — R0781 Pleurodynia: Secondary | ICD-10-CM | POA: Diagnosis not present

## 2023-02-25 DIAGNOSIS — M8580 Other specified disorders of bone density and structure, unspecified site: Secondary | ICD-10-CM | POA: Diagnosis not present

## 2023-02-25 DIAGNOSIS — M545 Low back pain, unspecified: Secondary | ICD-10-CM | POA: Diagnosis not present

## 2023-02-25 DIAGNOSIS — M4856XA Collapsed vertebra, not elsewhere classified, lumbar region, initial encounter for fracture: Secondary | ICD-10-CM | POA: Diagnosis not present

## 2023-02-25 DIAGNOSIS — M5489 Other dorsalgia: Secondary | ICD-10-CM | POA: Diagnosis not present

## 2023-03-02 DIAGNOSIS — D519 Vitamin B12 deficiency anemia, unspecified: Secondary | ICD-10-CM | POA: Diagnosis not present

## 2023-03-09 DIAGNOSIS — J383 Other diseases of vocal cords: Secondary | ICD-10-CM | POA: Diagnosis not present

## 2023-03-29 DIAGNOSIS — R490 Dysphonia: Secondary | ICD-10-CM | POA: Diagnosis not present

## 2023-03-29 DIAGNOSIS — M47812 Spondylosis without myelopathy or radiculopathy, cervical region: Secondary | ICD-10-CM | POA: Diagnosis not present

## 2023-03-29 DIAGNOSIS — J383 Other diseases of vocal cords: Secondary | ICD-10-CM | POA: Diagnosis not present

## 2023-03-29 DIAGNOSIS — I700 Atherosclerosis of aorta: Secondary | ICD-10-CM | POA: Diagnosis not present

## 2023-03-29 DIAGNOSIS — J439 Emphysema, unspecified: Secondary | ICD-10-CM | POA: Diagnosis not present

## 2023-03-29 DIAGNOSIS — I77810 Thoracic aortic ectasia: Secondary | ICD-10-CM | POA: Diagnosis not present

## 2023-03-29 DIAGNOSIS — R221 Localized swelling, mass and lump, neck: Secondary | ICD-10-CM | POA: Diagnosis not present

## 2023-04-01 ENCOUNTER — Telehealth: Payer: Self-pay | Admitting: Cardiology

## 2023-04-01 DIAGNOSIS — Z01818 Encounter for other preprocedural examination: Secondary | ICD-10-CM | POA: Diagnosis not present

## 2023-04-01 DIAGNOSIS — E039 Hypothyroidism, unspecified: Secondary | ICD-10-CM | POA: Diagnosis not present

## 2023-04-01 DIAGNOSIS — I10 Essential (primary) hypertension: Secondary | ICD-10-CM | POA: Diagnosis not present

## 2023-04-01 DIAGNOSIS — I2510 Atherosclerotic heart disease of native coronary artery without angina pectoris: Secondary | ICD-10-CM | POA: Diagnosis not present

## 2023-04-01 DIAGNOSIS — I4819 Other persistent atrial fibrillation: Secondary | ICD-10-CM | POA: Diagnosis not present

## 2023-04-01 DIAGNOSIS — J383 Other diseases of vocal cords: Secondary | ICD-10-CM | POA: Diagnosis not present

## 2023-04-01 DIAGNOSIS — E7849 Other hyperlipidemia: Secondary | ICD-10-CM | POA: Diagnosis not present

## 2023-04-01 NOTE — Telephone Encounter (Signed)
New Message:      Patient just left his pre-op appointment. When they did the EKG, it showed Afib. He was told to make an appointment to see Dr Dulce Sellar asap.

## 2023-04-01 NOTE — Telephone Encounter (Signed)
Tomeka a preop NP is calling stating there was a problem with the patient's EKG today. She states it reported Afib with RVR which she believes to be new for the patient. She is requesting a callback today from Dr. Dulce Sellar regarding this.   She ask that he try the first number before calling the secondary listed.   Please advise.

## 2023-04-03 DIAGNOSIS — I493 Ventricular premature depolarization: Secondary | ICD-10-CM | POA: Diagnosis not present

## 2023-04-03 DIAGNOSIS — I4510 Unspecified right bundle-branch block: Secondary | ICD-10-CM | POA: Diagnosis not present

## 2023-04-03 DIAGNOSIS — I4891 Unspecified atrial fibrillation: Secondary | ICD-10-CM | POA: Diagnosis not present

## 2023-04-05 ENCOUNTER — Ambulatory Visit: Payer: Medicare Other

## 2023-04-05 VITALS — BP 132/80 | HR 111 | Ht 70.0 in | Wt 136.6 lb

## 2023-04-05 DIAGNOSIS — Z01810 Encounter for preprocedural cardiovascular examination: Secondary | ICD-10-CM

## 2023-04-05 DIAGNOSIS — I4891 Unspecified atrial fibrillation: Secondary | ICD-10-CM

## 2023-04-05 DIAGNOSIS — E782 Mixed hyperlipidemia: Secondary | ICD-10-CM

## 2023-04-05 DIAGNOSIS — E0580 Other thyrotoxicosis without thyrotoxic crisis or storm: Secondary | ICD-10-CM

## 2023-04-05 HISTORY — DX: Unspecified atrial fibrillation: I48.91

## 2023-04-05 HISTORY — DX: Encounter for preprocedural cardiovascular examination: Z01.810

## 2023-04-05 HISTORY — DX: Other thyrotoxicosis without thyrotoxic crisis or storm: E05.80

## 2023-04-05 MED ORDER — METOPROLOL TARTRATE 25 MG PO TABS: 25.0000 mg | ORAL_TABLET | Freq: Two times a day (BID) | ORAL | 3 refills | Status: DC

## 2023-04-05 NOTE — Telephone Encounter (Signed)
Left vm to callback. Pt has an appt today with Dr. Vincent Gros.

## 2023-04-05 NOTE — Addendum Note (Signed)
Addended by: Roosvelt Harps R on: 04/05/2023 01:09 PM   Modules accepted: Orders

## 2023-04-05 NOTE — Patient Instructions (Addendum)
Medication Instructions:  Your physician recommends that you continue on your current medications as directed. Please refer to the Current Medication list given to you today.  *If you need a refill on your cardiac medications before your next appointment, please call your pharmacy*   Lab Work: NONE If you have labs (blood work) drawn today and your tests are completely normal, you will receive your results only by: MyChart Message (if you have MyChart) OR A paper copy in the mail If you have any lab test that is abnormal or we need to change your treatment, we will call you to review the results.   Testing/Procedures: Your physician has requested that you have an echocardiogram. Echocardiography is a painless test that uses sound waves to create images of your heart. It provides your doctor with information about the size and shape of your heart and how well your heart's chambers and valves are working. This procedure takes approximately one hour. There are no restrictions for this procedure. Please do NOT wear cologne, perfume, aftershave, or lotions (deodorant is allowed). Please arrive 15 minutes prior to your appointment time.   You have been asked to wear a Zio Heart Monitor today. It is to be worn for 14 days. Please remove the monitor on 9/17 and mail back in the box provided.  If you have any questions about the monitor please call the company at [phone]     Follow-Up: At [facility], you and your health needs are our priority.  As part of our continuing mission to provide you with exceptional heart care, we have created designated Provider Care Teams.  These Care Teams include your primary Cardiologist (physician) and Advanced Practice Providers (APPs -  Physician Assistants and Nurse Practitioners) who all work together to provide you with the care you need, when you need it.  We recommend signing up for the patient portal called "MyChart".  Sign up information is  provided on this After Visit Summary.  MyChart is used to connect with patients for Virtual Visits (Telemedicine).  Patients are able to view lab/test results, encounter notes, upcoming appointments, etc.  Non-urgent messages can be sent to your provider as well.   To learn more about what you can do with MyChart, go to [email or web address].    Your next appointment:   3 week(s)  Provider:   Huntley Dec, MD    Other Instructions

## 2023-04-05 NOTE — Telephone Encounter (Signed)
Advised that pt is to hold surgery until further testing and evaluation. Keith Watson verbalized understanding and had no additional questions.

## 2023-04-05 NOTE — Assessment & Plan Note (Addendum)
He remains asymptomatic. CHA2DS2-VASc Score = 6   This indicates a 9.7% annual risk of stroke. The patient's score is based upon: CHF History: 0 HTN History: 1 Diabetes History: 0 Stroke History: 2 Vascular Disease History: 1 Age Score: 2 Gender Score: 0    Overall his fall risk is significant, with recent unwitnessed falls and poor history regarding the circumstances surrounding it.  Hence we will hold off on anticoagulation at this time.  We discussed potential triggers for atrial fibrillation, in his case specifically iatrogenic hyperthyroidism may be contributing it.  Will follow-up with Zio patch for 14 days to assess A-fib burden and rate control.  Rate control versus rhythm control options discussed. Will start metoprolol tartrate 25 mg twice daily for rate control at this time. Will obtain a transthoracic echocardiogram to assess for any significant structural and functional changes given the repolarization changes noted.  Return to clinic Tentatively in 3 weeks

## 2023-04-05 NOTE — Telephone Encounter (Signed)
Spoke with patient and got him scheduled to see Dr.  Vincent Gros today 04/05/23 at 11:20 am for his surgical clearance/afib.

## 2023-04-05 NOTE — Progress Notes (Signed)
Cardiology Consultation:    Date:  04/05/2023   ID:  Keith Watson., DOB 1934/05/01, MRN [id number]  PCP:  Keith Fusi, Watson  Cardiologist:  Keith Watson   Referring Watson: Keith Fusi, Watson   No chief complaint on file. Preop cardiovascular exam and new diagnosis of atrial fibrillation   ASSESSMENT AND PLAN:   Keith Watson 89 year old male patient with prior history of CAD s/p CABG in 2011, TIA/CVA in 2020 and 2023, distal thoracic and abdominal aortic aneurysm, mild aortic insufficiency and tricuspid insufficiency, chronic RBBB, nonobstructive carotid artery disease, mesenteric artery stenosis, hyperlipidemia, with vocal cord mass pending surgery now diagnosed with atrial fibrillation on preop workup, remains asymptomatic.  Problem List Items Addressed This Visit     Hyperlipidemia    Last  lipid panel available to review is from August 2023 total cholesterol 142, HDL 64, LDL 64, triglycerides 72 with good control.  In the setting given his age lower the atorvastatin dose to 40 mg once daily.       Preoperative cardiovascular examination    Overall with current findings of new diagnosis of atrial fibrillation with uncontrolled heart rate, T wave changes, iatrogenic hyperthyroidism, hold off on preoperative cardiovascular risk assessment until further workup  Will obtain echocardiogram and event monitor as reviewed above. Will follow-up in 3 weeks in the office given his symptoms. His family members are tentatively going to accompany him for the visit.  Based on rate control and his symptoms, we will further consider her stress test at the time for perioperative cardiovascular risk assessment.  They do understand that this means the overall surgery may be delayed by about a month.        Relevant Orders   EKG 12-Lead (Completed)   LONG TERM MONITOR (3-14 DAYS)   ECHOCARDIOGRAM COMPLETE   Atrial fibrillation, new diagnosis August 2024 on preop  EKG - Primary    He remains asymptomatic. CHA2DS2-VASc Score = 6   This indicates a 9.7% annual risk of stroke. The patient's score is based upon: CHF History: 0 HTN History: 1 Diabetes History: 0 Stroke History: 2 Vascular Disease History: 1 Age Score: 2 Gender Score: 0    Overall his fall risk is significant, with recent unwitnessed falls and poor history regarding the circumstances surrounding it.  Hence we will hold off on anticoagulation at this time.  We discussed potential triggers for atrial fibrillation, in his case specifically iatrogenic hyperthyroidism may be contributing it.  Will follow-up with Zio patch for 14 days to assess A-fib burden and rate control.  Rate control versus rhythm control options discussed. Will start metoprolol tartrate 25 mg twice daily for rate control at this time. Will obtain a transthoracic echocardiogram to assess for any significant structural and functional changes given the repolarization changes noted.  Return to clinic Tentatively in 3 weeks        Relevant Orders   EKG 12-Lead (Completed)   LONG TERM MONITOR (3-14 DAYS)   ECHOCARDIOGRAM COMPLETE   Iatrogenic hyperthyroidism    Suppressed TSH at 0.05 and elevated free T4 at 2.4 on recent lab work from 04-01-2023 on current levothyroxine therapy 150 mcg  Once a day. Likely in the setting of ongoing weight loss over the past 10 months, his levothyroxine requirements may have reduced.  Will recommend to review with PCP and adjust the dose accordingly.       Relevant Orders   LONG TERM MONITOR (3-14 DAYS)   ECHOCARDIOGRAM  COMPLETE      History of Present Illness:    Keith Watson. is a 89 y.o. male who is being seen today for the evaluation of preop cardiovascular risk assessment prior to microlaryngoscopy with CO2 laser being scheduled with Keith Watson at [facility] for vocal cord mass, at the request of Keith Fusi, Watson as he was noted to have atrial  fibrillation on preop EKG done 04-01-2023.  He was recently seen by Keith Watson at our office on Dec 29, 2022.\ Here for the visit today accompanied by his caregiver Keith Watson who is with him typically 3 hours in the morning and 3 hours in the afternoons. His daughter-in-law Keith Watson was present over the phone for this discussion.  He himself is a poor historian regarding prior palp information but able to give information about his symptoms reliably. Rest of the information obtained from talking to patient's caregiver, family members, chart reviewed.  Pleasant gentleman with history of CAD s/p CABG in 2011, mild aortic insufficiency, mild TR, chronic RBBB, TIA/CVA in 2020 (foot drop-resolved), TIA/CVA 06/2022 (dysarthria-resolved), hypertension, dyslipidemia, carotid artery disease nonobstructive, thoracic [3.7 cm ascending portion, distal thoracic aorta 4.1 cm] and abdominal aortic [5.3 cm x 5.8 cm] aneurysm for which she follows up with CT surgery, diffuse aortic atherosclerosis with moderate to severe stenosis noted of mesenteric vasculature.  He also has a history of hypothyroidism, GERD.  He currently reports no cardiac symptoms.  Denies any chest pain, shortness of breath, palpitations, lightheadedness, dizziness. He reportedly has had couple falls in the last many months, unwitnessed with some injury to his knee.  He does not have recollection about the events preceding it.  Last episode was couple weeks ago.  He has lost 30 pounds in the past 10 months. With ongoing hoarseness of the voice, vocal cord mass noted he is pending surgery.  Denies any Dysphagia, odynophagia, aspiration symptoms.  No pedal edema, no claudication symptoms.  He has no been taking his evening dose of medications. Based on discussion today appears to be only taking aspirin, atorvastatin and levothyroxine.  Last echocardiogram available to review is from [facility] June 24, 2022, reported LVEF normal 55  to 60%, sigmoid septal hypertrophy, mildly dilated left atrium, mild aortic insufficiency and mild TR.  Agitated saline study was noted to be suboptimal.  Previous ZIO monitor was from July 2020 14-day study in the setting of his TIA/CVA noted no evidence of atrial fibrillation.  Sinus rhythm with average heart rate 68/min.  Rare ventricular and supraventricular ectopy.  EKG today in the clinic from April 05, 2023 noted atrial fibrillation with heart rate 111 per minute, QRS once again noted right bundle branch block morphology 130 ms duration.  In for T wave changes inversions are new and can suggest inferolateral ischemia.  Last lipid panel available to review is from August 2023 total cholesterol 142, HDL 64, LDL 64, triglycerides 72  Last TSH from April 01, 2023 0.05, reduced. Free T4 elevated at 2.4.  WBC 7.4, hemoglobin 16.8, hematocrit 48.2, platelets 141. Basic metabolic panel sodium 138, potassium 3.2, BUN 9, creatinine 0.99, EGFR greater than 74, normal  Follow-up imaging CT chest and abdomen pelvis ordered by Keith Watson to follow-up on aortic aneurysm is currently pending.    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HCC)    Chronic GERD    Controlled insomnia    Elevated fasting blood sugar    Erectile disorder due to medical condition in male    Hyperlipidemia 01/08/2019   Hypertension 01/08/2019   Multi-vessel coronary artery stenosis    Olecranon bursitis of left elbow    PVD (peripheral vascular disease) (HCC)    Thyroid disease 01/08/2019   TIA (transient ischemic attack)     Past Surgical History:  Procedure Laterality Date   CORONARY ARTERY BYPASS GRAFT  2011   ELBOW SURGERY  10/2017   Bursitis   SALIVARY GLAND SURGERY  2016   Removal due to cancer   TONSILLECTOMY  1957     Current Medications: Current Meds  Medication Sig   aspirin EC 81 MG tablet Take 81 mg by mouth daily.   atorvastatin (LIPITOR) 80 MG tablet Take 80 mg by mouth at bedtime.   cloNIDine (CATAPRES) 0.1 MG tablet Take 1 tablet (0.1 mg total) by mouth at bedtime as needed (Blood pressure greater than 180/100).   clopidogrel (PLAVIX) 75 MG tablet Take 1 tablet (75 mg total) by mouth daily.   cyanocobalamin (,VITAMIN B-12,) 1000 MCG/ML injection Inject 1,000 mcg into the muscle every 30 (thirty) days.   ezetimibe (ZETIA) 10 MG tablet Take 10 mg by mouth daily.   levothyroxine (SYNTHROID) 150 MCG tablet Take 150 mcg by mouth daily before breakfast.   lisinopril (ZESTRIL) 10 MG tablet Take 1 tablet (10 mg total) by mouth daily.   meclizine (ANTIVERT) 25 MG tablet Take 25 mg by mouth 3 (three) times daily as needed for dizziness.   mirabegron ER (MYRBETRIQ) 50 MG TB24 tablet Take 50 mg by mouth daily.   nitroGLYCERIN (NITROSTAT) 0.4 MG SL tablet Place 0.4 mg under the tongue every 5 (five) minutes as needed for chest pain.   omeprazole (PRILOSEC) 20 MG capsule Take 20 mg by mouth daily.   traZODone (DESYREL) 100 MG tablet Take 100 mg by mouth at bedtime.     Allergies:   Patient has no known allergies.   Social History   Socioeconomic History   Marital status: Married    Spouse name: Not on file   Number of children: Not on file   Years of education: Not on file   Highest education level: Not on file  Occupational History   Occupation: Freight forwarder    Comment: Retired  Tobacco Use   Smoking status: Former    Current packs/day: 0.00    Average packs/day: 1 pack/day for 10.0 years (10.0 ttl pk-yrs)    Types: Cigarettes    Start date: 75    Quit date: 1970    Years since quitting: 54.7   Smokeless tobacco: Never   Tobacco comments:    quit 45 years  Vaping Use   Vaping status: Never Used  Substance and Sexual Activity   Alcohol use: Yes    Alcohol/week: 1.0 - 2.0  standard drink of alcohol    Types: 1 - 2 Standard drinks or equivalent per week    Comment: 1-2 cocktails daily   Drug use: Never   Sexual activity: Not on file  Other Topics Concern   Not on file  Social History Narrative   Not on file   Social Determinants of Health   Financial Resource Strain: Not on file  Food Insecurity: Not on file  Transportation Needs: Not on file  Physical Activity: Not on file  Stress: Not  on file  Social Connections: Not on file     Family History: The patient's family history includes CAD in his mother; Heart attack in his father and mother; Heart disease in his father; Hypertension in his mother. ROS:   Please see the history of present illness.    All 14 point review of systems negative except as described per history of present illness.  EKGs/Labs/Other Studies Reviewed:    The following studies were reviewed today:   EKG:  EKG Interpretation Date/Time:  Tuesday April 05 2023 11:16:50 EDT Ventricular Rate:  111 PR Interval:    QRS Duration:  130 QT Interval:  368 QTC Calculation: 500 R Axis:   108  Text Interpretation: Atrial fibrillation with rapid ventricular response Right bundle branch block T wave abnormality, consider inferolateral ischemia When compared with ECG of 28-Feb-2021 10:33, Atrial fibrillation has replaced Sinus rhythm QRS axis Shifted right Criteria for Inferior infarct are no longer Present T wave inversion now evident in Inferior leads T wave inversion now evident in Lateral leads Confirmed by Huntley Dec reddy [phone]) on 04/05/2023 11:18:53 AM    Recent Labs: No results found for requested labs within last 365 days.  Recent Lipid Panel    Component Value Date/Time   CHOL 142 03/05/2022 1056   TRIG 72 03/05/2022 1056   HDL 64 03/05/2022 1056   CHOLHDL 2.2 03/05/2022 1056   CHOLHDL 2.8 02/28/2021 2305   VLDL 17 02/28/2021 2305   LDLCALC 64 03/05/2022 1056    Physical Exam:    VS:  BP 132/80 (BP  Location: Left Arm, Patient Position: Sitting, Cuff Size: Normal)   Pulse (!) 111   Ht 5\' 10"  (1.778 m)   Wt 136 lb 9.6 oz (62 kg)   SpO2 95%   BMI 19.60 kg/m     Wt Readings from Last 3 Encounters:  04/05/23 136 lb 9.6 oz (62 kg)  12/29/22 148 lb (67.1 kg)  03/05/22 156 lb (70.8 kg)     GENERAL:  Well nourished, well developed in no acute distress NECK: No JVD; No carotid bruits CARDIAC: Irregularly irregular, tachycardic, S1 and S2 present, no murmurs, no rubs, no gallops CHEST:  Clear to auscultation without rales, wheezing or rhonchi  Extremities: No pitting pedal edema. Pulses bilaterally symmetric with radial 2+ and dorsalis pedis 2+ NEUROLOGIC:  Alert and oriented x 3  Medication Adjustments/Labs and Tests Ordered: Current medicines are reviewed at length with the patient today.  Concerns regarding medicines are outlined above.  Orders Placed This Encounter  Procedures   LONG TERM MONITOR (3-14 DAYS)   EKG 12-Lead   ECHOCARDIOGRAM COMPLETE   No orders of the defined types were placed in this encounter.   Signed, Cecille Amsterdam, Watson, MPH, [facility] 04/05/2023 12:22 PM    [facility]

## 2023-04-05 NOTE — Assessment & Plan Note (Signed)
Last  lipid panel available to review is from August 2023 total cholesterol 142, HDL 64, LDL 64, triglycerides 72 with good control.  In the setting given his age lower the atorvastatin dose to 40 mg once daily.

## 2023-04-05 NOTE — Assessment & Plan Note (Addendum)
Suppressed TSH at 0.05 and elevated free T4 at 2.4 on recent lab work from 04-01-2023 on current levothyroxine therapy 150 mcg  Once a day. Likely in the setting of ongoing weight loss over the past 10 months, his levothyroxine requirements may have reduced.  Will recommend to review with PCP and adjust the dose accordingly.

## 2023-04-05 NOTE — Assessment & Plan Note (Signed)
Overall with current findings of new diagnosis of atrial fibrillation with uncontrolled heart rate, T wave changes, iatrogenic hyperthyroidism, hold off on preoperative cardiovascular risk assessment until further workup  Will obtain echocardiogram and event monitor as reviewed above. Will follow-up in 3 weeks in the office given his symptoms. His family members are tentatively going to accompany him for the visit.  Based on rate control and his symptoms, we will further consider her stress test at the time for perioperative cardiovascular risk assessment.  They do understand that this means the overall surgery may be delayed by about a month.

## 2023-04-05 NOTE — Telephone Encounter (Signed)
Follow Up:   Patient's daughter is calling back. She says she needs to know when can patient be seen, since he was told to be seen asap please.m

## 2023-04-06 DIAGNOSIS — D519 Vitamin B12 deficiency anemia, unspecified: Secondary | ICD-10-CM | POA: Diagnosis not present

## 2023-04-11 ENCOUNTER — Other Ambulatory Visit: Payer: Self-pay

## 2023-04-13 ENCOUNTER — Ambulatory Visit: Payer: Medicare Other

## 2023-04-13 VITALS — BP 122/82 | HR 52 | Ht 70.0 in | Wt 136.4 lb

## 2023-04-13 DIAGNOSIS — I4891 Unspecified atrial fibrillation: Secondary | ICD-10-CM | POA: Diagnosis not present

## 2023-04-13 DIAGNOSIS — Z7189 Other specified counseling: Secondary | ICD-10-CM | POA: Diagnosis not present

## 2023-04-13 DIAGNOSIS — I25810 Atherosclerosis of coronary artery bypass graft(s) without angina pectoris: Secondary | ICD-10-CM

## 2023-04-13 DIAGNOSIS — Z01810 Encounter for preprocedural cardiovascular examination: Secondary | ICD-10-CM

## 2023-04-13 HISTORY — DX: Other specified counseling: Z71.89

## 2023-04-13 NOTE — Patient Instructions (Signed)
Medication Instructions:  Your physician recommends that you continue on your current medications as directed. Please refer to the Current Medication list given to you today.  *If you need a refill on your cardiac medications before your next appointment, please call your pharmacy*   Lab Work: None If you have labs (blood work) drawn today and your tests are completely normal, you will receive your results only by: MyChart Message (if you have MyChart) OR A paper copy in the mail If you have any lab test that is abnormal or we need to change your treatment, we will call you to review the results.   Testing/Procedures: None   Follow-Up: At [facility], you and your health needs are our priority.  As part of our continuing mission to provide you with exceptional heart care, we have created designated Provider Care Teams.  These Care Teams include your primary Cardiologist (physician) and Advanced Practice Providers (APPs -  Physician Assistants and Nurse Practitioners) who all work together to provide you with the care you need, when you need it.  We recommend signing up for the patient portal called "MyChart".  Sign up information is provided on this After Visit Summary.  MyChart is used to connect with patients for Virtual Visits (Telemedicine).  Patients are able to view lab/test results, encounter notes, upcoming appointments, etc.  Non-urgent messages can be sent to your provider as well.   To learn more about what you can do with MyChart, go to [email or web address].    Your next appointment:   1 month(s)  Provider:   Huntley Dec, MD    Other Instructions None

## 2023-04-13 NOTE — Assessment & Plan Note (Signed)
CHADS2 Vascor 6, 9.7% annual stroke risk. Significant fall risk with recent unwitnessed falls as discussed on prior visit. Hold off on anticoagulation for now until reassess A-fib burden on Zio patch for 14 days which is currently active.  Echocardiogram pending to assess underlying cardiac structure and function. Continue metoprolol tartrate 25 mg twice daily for rate control. By auscultation appears to be in A-fib today.  We did not repeat an EKG today and will await event monitor results.  Daughter acknowledged understanding his high fall risk and bleeding if he is on anticoagulation.  They are going to review plans for long-term supervision so as to mitigate the fall risk. Anticoagulation and Watchman options could be discussed for stroke prophylaxis depending on the ability to avoid fall risk.

## 2023-04-13 NOTE — Assessment & Plan Note (Signed)
As noted at last visit, will review echocardiogram Zio patch results once available and consider stress test. Daughter mentions that undecided about the surgery and if this is even going to be offered.  From cardiac standpoint given for assessment of his functional status, if he needs to proceed with surgery under general anesthesia I would recommend a Lexiscan stress test with nuclear imaging.

## 2023-04-13 NOTE — Assessment & Plan Note (Signed)
Reviewed his current medications and adherence and reviewed rationale for this current cardiac medications.  In summary continue aspirin 81 mg once daily given his underlying CAD history. Would recommend resuming atorvastatin 40 mg once daily, he is currently not been taking this for over a year, they have a visit coming up with PCP.  Abnormality consider all these medications and his preference to take them and will take final decision about starting atorvastatin.  Hence I am not prescribing the medication today. Continue Zetia and metoprolol at current doses.

## 2023-04-13 NOTE — Assessment & Plan Note (Signed)
Given his history of CABG, continue aspirin. Would recommend atorvastatin 40 mg once daily given his advanced age.

## 2023-04-13 NOTE — Progress Notes (Signed)
Cardiology Office Note:    Date:  04/13/2023   ID:  Keith Royals Richardson Landry., DOB Aug 23, 1933, MRN [id number]  PCP:  Paulina Fusi, MD  Cardiologist:  Marlyn Corporal Iolani Twilley, MD    Referring MD: Paulina Fusi, MD   No chief complaint on file. Follow-up for medication reconciliation and perioperative cardiovascular risk assessment plan review  History of Present Illness:    Keith Watson. is a 89 y.o. male here for follow-up visit today. Accompanied by his daughter Keith Watson. He recently had a visit with t me on 04/05/2023 for an initial consult for preop cardiovascular risk assessment for vocal cord surgery and was noted to have atrial fibrillation with twelve-lead EKG.  His daughter accompanying him today mentions she was able to review his medications at home with his caregiver Rene Kocher present there 2 to 3 hours in the morning and 2 to 3 hours in the afternoon]. They are here to review his medications from cardiac standpoint.  She mentions he remains at high risk for falls at home. We have reviewed his recent diagnosis of atrial fibrillation and the workup we planned at the last visit with echocardiogram and Zio patch. He continues to wear the Zio patch at this time. Echocardiogram is scheduled for September 27.  He denies any significant change with symptoms compared to last time. PCP had lowered the levothyroxine dose down to 125 mcg/day.  Daughter mentions he has not been taking any of all his medications as prescribed. We reviewed his medications at this time and currently has been taking aspirin 81 mg once daily, Zetia 10 mg once daily, levothyroxine 125 mcg once daily, metoprolol tartrate 25 mg twice daily.   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HCC)    Chronic GERD     Controlled insomnia    Elevated fasting blood sugar    Erectile disorder due to medical condition in male    Hyperlipidemia 01/08/2019   Hypertension 01/08/2019   Multi-vessel coronary artery stenosis    Olecranon bursitis of left elbow    PVD (peripheral vascular disease) (HCC)    Thyroid disease 01/08/2019   TIA (transient ischemic attack)     Past Surgical History:  Procedure Laterality Date   CORONARY ARTERY BYPASS GRAFT  2011   ELBOW SURGERY  10/2017   Bursitis   SALIVARY GLAND SURGERY  2016   Removal due to cancer   TONSILLECTOMY  1957    Current Medications: Current Meds  Medication Sig   aspirin EC 81 MG tablet Take 81 mg by mouth daily.   atorvastatin (LIPITOR) 40 MG tablet Take 40 mg by mouth daily.   clopidogrel (PLAVIX) 75 MG tablet Take 1 tablet (75 mg total) by mouth daily.   cyanocobalamin (,VITAMIN B-12,) 1000 MCG/ML injection Inject 1,000 mcg into the muscle every 30 (thirty) days.   ezetimibe (ZETIA) 10 MG tablet Take 10 mg by mouth daily.   levothyroxine (SYNTHROID) 150 MCG tablet Take 150 mcg by mouth daily before breakfast.   lisinopril (ZESTRIL) 10 MG tablet Take 1 tablet (10 mg total) by mouth daily.   meclizine (ANTIVERT) 25 MG tablet Take 25 mg by mouth 3 (three) times daily as needed for dizziness.   metoprolol tartrate (LOPRESSOR) 25 MG tablet Take 1 tablet (25 mg  total) by mouth 2 (two) times daily.   mirabegron ER (MYRBETRIQ) 50 MG TB24 tablet Take 50 mg by mouth daily.   nitroGLYCERIN (NITROSTAT) 0.4 MG SL tablet Place 0.4 mg under the tongue every 5 (five) minutes as needed for chest pain.   omeprazole (PRILOSEC) 20 MG capsule Take 20 mg by mouth daily.   traZODone (DESYREL) 100 MG tablet Take 100 mg by mouth at bedtime.     Allergies:   Patient has no known allergies.   Social History   Socioeconomic History   Marital status: Married    Spouse name: Not on file   Number of children: Not on file   Years of education: Not on file   Highest  education level: Not on file  Occupational History   Occupation: Freight forwarder    Comment: Retired  Tobacco Use   Smoking status: Former    Current packs/day: 0.00    Average packs/day: 1 pack/day for 10.0 years (10.0 ttl pk-yrs)    Types: Cigarettes    Start date: 87    Quit date: 1970    Years since quitting: 54.7   Smokeless tobacco: Never   Tobacco comments:    quit 45 years  Vaping Use   Vaping status: Never Used  Substance and Sexual Activity   Alcohol use: Yes    Alcohol/week: 1.0 - 2.0 standard drink of alcohol    Types: 1 - 2 Standard drinks or equivalent per week    Comment: 1-2 cocktails daily   Drug use: Never   Sexual activity: Not on file  Other Topics Concern   Not on file  Social History Narrative   Not on file   Social Determinants of Health   Financial Resource Strain: Not on file  Food Insecurity: Not on file  Transportation Needs: Not on file  Physical Activity: Not on file  Stress: Not on file  Social Connections: Not on file     Family History: The patient's family history includes CAD in his mother; Heart attack in his father and mother; Heart disease in his father; Hypertension in his mother. ROS:   Please see the history of present illness.    All 14 point review of systems negative except as described per history of present illness  EKGs/Labs/Other Studies Reviewed:         Recent Labs: No results found for requested labs within last 365 days.  Recent Lipid Panel    Component Value Date/Time   CHOL 142 03/05/2022 1056   TRIG 72 03/05/2022 1056   HDL 64 03/05/2022 1056   CHOLHDL 2.2 03/05/2022 1056   CHOLHDL 2.8 02/28/2021 2305   VLDL 17 02/28/2021 2305   LDLCALC 64 03/05/2022 1056    Physical Exam:    VS:  BP 122/82 (BP Location: Left Arm, Patient Position: Sitting, Cuff Size: Normal)   Pulse (!) 52   Ht 5\' 10"  (1.778 m)   Wt 136 lb 6.4 oz (61.9 kg)   SpO2 96%   BMI 19.57 kg/m     Wt Readings from Last 3  Encounters:  04/13/23 136 lb 6.4 oz (61.9 kg)  04/05/23 136 lb 9.6 oz (62 kg)  12/29/22 148 lb (67.1 kg)     GENERAL:  Well nourished, well developed in no acute distress NECK: No JVD; No carotid bruits CARDIAC: RRR, S1 and S2 present, no murmurs, no rubs, no gallops Extremities: No pitting pedal edema. Pulses bilaterally symmetric with radial 2+ and dorsalis pedis 2+  NEUROLOGIC:  Alert and oriented x 3   ASSESSMENT AND PLAN:   Mr. Herridge 89 year old male patient with history of CAD s/p CABG in 2011, TIA/CVA in 2020 and 2023, distal thoracic and abdominal aortic aneurysm, mild aortic insufficiency, mild tricuspid valve insufficiency, chronic right bundle branch block, nonobstructive carotid artery disease, [place] eccentric artery stenosis on CT abdomen imaging, hyperlipidemia, new diagnosis of atrial fibrillation at preop visit EKG, high risk for falls.  Problem List Items Addressed This Visit     CAD (coronary artery disease)    Given his history of CABG, continue aspirin. Would recommend atorvastatin 40 mg once daily given his advanced age.        Relevant Medications   atorvastatin (LIPITOR) 40 MG tablet   Preoperative cardiovascular examination    As noted at last visit, will review echocardiogram Zio patch results once available and consider stress test. Daughter mentions that undecided about the surgery and if this is even going to be offered.  From cardiac standpoint given for assessment of his functional status, if he needs to proceed with surgery under general anesthesia I would recommend a Lexiscan stress test with nuclear imaging.      Atrial fibrillation, new diagnosis August 2024 on preop EKG - Primary    CHADS2 Vascor 6, 9.7% annual stroke risk. Significant fall risk with recent unwitnessed falls as discussed on prior visit. Hold off on anticoagulation for now until reassess A-fib burden on Zio patch for 14 days which is currently active.  Echocardiogram pending  to assess underlying cardiac structure and function. Continue metoprolol tartrate 25 mg twice daily for rate control. By auscultation appears to be in A-fib today.  We did not repeat an EKG today and will await event monitor results.  Daughter acknowledged understanding his high fall risk and bleeding if he is on anticoagulation.  They are going to review plans for long-term supervision so as to mitigate the fall risk. Anticoagulation and Watchman options could be discussed for stroke prophylaxis depending on the ability to avoid fall risk.       Relevant Medications   atorvastatin (LIPITOR) 40 MG tablet   Medication care plan discussed with patient    Reviewed his current medications and adherence and reviewed rationale for this current cardiac medications.  In summary continue aspirin 81 mg once daily given his underlying CAD history. Would recommend resuming atorvastatin 40 mg once daily, he is currently not been taking this for over a year, they have a visit coming up with PCP.  Abnormality consider all these medications and his preference to take them and will take final decision about starting atorvastatin.  Hence I am not prescribing the medication today. Continue Zetia and metoprolol at current doses.      Return to clinic after results of the echocardiogram and Zio patch are available.   Medication Adjustments/Labs and Tests Ordered: Current medicines are reviewed at length with the patient today.  Concerns regarding medicines are outlined above.  No orders of the defined types were placed in this encounter.  Medication changes: No orders of the defined types were placed in this encounter.   Signed, Cecille Amsterdam, MD, MPH, [facility] 04/13/2023 1:38 PM    [facility]

## 2023-04-14 DIAGNOSIS — Z9861 Coronary angioplasty status: Secondary | ICD-10-CM | POA: Diagnosis not present

## 2023-04-14 DIAGNOSIS — G4700 Insomnia, unspecified: Secondary | ICD-10-CM | POA: Diagnosis not present

## 2023-04-14 DIAGNOSIS — I2510 Atherosclerotic heart disease of native coronary artery without angina pectoris: Secondary | ICD-10-CM | POA: Diagnosis not present

## 2023-04-14 DIAGNOSIS — R7301 Impaired fasting glucose: Secondary | ICD-10-CM | POA: Diagnosis not present

## 2023-04-14 DIAGNOSIS — D519 Vitamin B12 deficiency anemia, unspecified: Secondary | ICD-10-CM | POA: Diagnosis not present

## 2023-04-14 DIAGNOSIS — E039 Hypothyroidism, unspecified: Secondary | ICD-10-CM | POA: Diagnosis not present

## 2023-04-14 DIAGNOSIS — E785 Hyperlipidemia, unspecified: Secondary | ICD-10-CM | POA: Diagnosis not present

## 2023-04-21 DIAGNOSIS — I4891 Unspecified atrial fibrillation: Secondary | ICD-10-CM | POA: Diagnosis not present

## 2023-04-21 DIAGNOSIS — Z01810 Encounter for preprocedural cardiovascular examination: Secondary | ICD-10-CM | POA: Diagnosis not present

## 2023-04-25 ENCOUNTER — Telehealth: Payer: Self-pay

## 2023-04-25 NOTE — Telephone Encounter (Signed)
Advised results have not been reviewed by provider. Advised once reviewed we will call with results and recommendations.

## 2023-04-25 NOTE — Telephone Encounter (Signed)
Patient's daughter is calling for her father's monitor results.

## 2023-04-26 ENCOUNTER — Ambulatory Visit: Payer: Medicare Other

## 2023-05-06 ENCOUNTER — Ambulatory Visit: Payer: Medicare Other

## 2023-05-06 DIAGNOSIS — Z01810 Encounter for preprocedural cardiovascular examination: Secondary | ICD-10-CM | POA: Diagnosis not present

## 2023-05-06 DIAGNOSIS — E0580 Other thyrotoxicosis without thyrotoxic crisis or storm: Secondary | ICD-10-CM | POA: Diagnosis not present

## 2023-05-06 DIAGNOSIS — I4891 Unspecified atrial fibrillation: Secondary | ICD-10-CM | POA: Diagnosis not present

## 2023-05-06 LAB — ECHOCARDIOGRAM COMPLETE
AR max vel: 2.19 cm2
AV Area VTI: 2.65 cm2
AV Area mean vel: 2.03 cm2
AV Mean grad: 5 mm[Hg]
AV Peak grad: 8.5 mm[Hg]
Ao pk vel: 1.46 m/s
Area-P 1/2: 4.1 cm2
MV M vel: 5.44 m/s
MV Peak grad: 118.4 mm[Hg]
P 1/2 time: 562 ms
Radius: 0.5 cm
S' Lateral: 2.9 cm

## 2023-05-11 DIAGNOSIS — Z23 Encounter for immunization: Secondary | ICD-10-CM | POA: Diagnosis not present

## 2023-05-11 DIAGNOSIS — D519 Vitamin B12 deficiency anemia, unspecified: Secondary | ICD-10-CM | POA: Diagnosis not present

## 2023-05-13 DIAGNOSIS — I10 Essential (primary) hypertension: Secondary | ICD-10-CM | POA: Insufficient documentation

## 2023-05-16 ENCOUNTER — Other Ambulatory Visit: Payer: Self-pay

## 2023-05-16 ENCOUNTER — Telehealth: Payer: Self-pay

## 2023-05-16 DIAGNOSIS — I25810 Atherosclerosis of coronary artery bypass graft(s) without angina pectoris: Secondary | ICD-10-CM

## 2023-05-16 DIAGNOSIS — E039 Hypothyroidism, unspecified: Secondary | ICD-10-CM | POA: Diagnosis not present

## 2023-05-16 MED ORDER — METOPROLOL TARTRATE 50 MG PO TABS: 50.0000 mg | ORAL_TABLET | Freq: Two times a day (BID) | ORAL | 3 refills | Status: DC

## 2023-05-16 NOTE — Telephone Encounter (Signed)
-----   Message from Nurse Gerlene Burdock C sent at 05/16/2023  2:32 PM EDT ----- Please call this patient and schedule them for a Lexiscan. ----- Message ----- From: Luretha Murphy, MD Sent: 05/16/2023  11:58 AM EDT To: Samson Frederic, RN  Please inform him [his daughter] about the test results from the echocardiogram showing low normal pumping function of the left heart and mildly reduced pumping function of the heart on the right side.  Wall motion of the heart is consistent with prior history of CABG  There is mild to moderate leakiness associated with the valves on the left side of the heart.  No intervention needed for these at this time.  Will continue to follow-up with annual imaging.  Aorta size appears similar to his other imaging.  The event monitor results show up to 14% ventricular ectopy burden [extra beats from the lower chambers of the heart] which is considered frequent.  Plan: -Titrate up the dose of metoprolol tartrate to 50 mg twice daily -Please schedule for Lexiscan stress test with nuclear imaging to assess for any significant blood flow concerns to the heart. -Schedule for office visit follow-up after the stress test.

## 2023-05-16 NOTE — Telephone Encounter (Signed)
Patient scheduled for lexi scan on 10/22. Thank you!

## 2023-05-17 ENCOUNTER — Ambulatory Visit: Payer: Medicare Other

## 2023-05-17 ENCOUNTER — Telehealth (HOSPITAL_COMMUNITY): Payer: Self-pay | Admitting: *Deleted

## 2023-05-17 VITALS — BP 130/82 | HR 90 | Ht 71.0 in | Wt 137.0 lb

## 2023-05-17 DIAGNOSIS — I25810 Atherosclerosis of coronary artery bypass graft(s) without angina pectoris: Secondary | ICD-10-CM

## 2023-05-17 DIAGNOSIS — I493 Ventricular premature depolarization: Secondary | ICD-10-CM

## 2023-05-17 DIAGNOSIS — Z01810 Encounter for preprocedural cardiovascular examination: Secondary | ICD-10-CM | POA: Diagnosis not present

## 2023-05-17 HISTORY — DX: Ventricular premature depolarization: I49.3

## 2023-05-17 NOTE — Assessment & Plan Note (Signed)
14% burden on recent event monitor. Asymptomatic. Advised to reduce alcohol consumption to no more than 1 drink 2 or 3 times a week.  We increase his metoprolol dose to 50 mg twice daily after reviewing the echo and event monitor results earlier this week. Continue optimizing thyroid status with adjusting of levothyroxine dose per PCP.

## 2023-05-17 NOTE — Assessment & Plan Note (Signed)
Remains on dual antiplatelet therapy in the setting of prior CVA and TIA Continue with atorvastatin 40 mg once daily. In the setting of ongoing frequent ventricular ectopy burden, wall motion abnormality with low normal LV function and mildly reduced RV function be will review for any significant ischemia burden with Lexiscan stress test with nuclear imaging for any high risk findings that can affect his perioperative restratification while he awaits surgery for the vocal cords.

## 2023-05-17 NOTE — Progress Notes (Signed)
Cardiology Office Note:    Date:  05/17/2023   ID:  Keith Watson., DOB 21-Sep-1933, MRN [id number]  PCP:  Paulina Fusi, MD  Cardiologist:  Marlyn Corporal Trinady Milewski, MD    Referring MD: Paulina Fusi, MD   Chief Complaint  Patient presents with   Results    History of Present Illness:    Keith Watson. is a 89 y.o. male here for follow-up visit. His last visit in our office was 04-13-2023.  Initial consult was 04-05-2023.  Atrial fibrillation recently diagnosed on preop EKG workup not on anticoagulation due to fall risk, CAD s/p CABG in 2011, TIA/CVA in 2020 and 2023 and remains on dual antiplatelet therapy, distal thoracic and abdominal aortic aneurysm, mild aortic insufficiency, mild to moderate mitral regurgitation, mild tricuspid regurgitation, chronic right bundle branch block, nonobstructive carotid artery disease, nonobstructive mesenteric artery stenosis on CT abdomen imaging, hyperlipidemia, low normal LVEF and mildly reduced RV function with baseline LV wall motion abnormalities on recent echocardiogram October 2024, frequent ventricular ectopy 14% burden asymptomatic, hypothyroidism on replacement with levothyroxine with over suppressed TSH levels, dose being reduced, moderate alcohol consumption daily, 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  He himself denies any symptoms of palpitations.  Functional status limited due to advanced age and fall risk.  His PCP recently further lowered the dose of levothyroxine. He continues to drink bourbon every night.  14-day event monitor from 04-27-2023 noted atrial fibrillation throughout the study with 14.2% ventricular ectopy burden, average heart rate 81/min [ranging from 42 bpm to 148 bpm].  Echocardiogram from May 06, 2023 with LVEF 50 to 55%, wall motion abnormalities  showing hypokinetic basal inferior and inferoseptal segments.  Mildly reduced RV function, mild to moderate mitral regurgitation, mild aortic insufficiency.  Aortic root measuring 4 cm, ascending aorta 3.8 cm, descending aorta 2.7 cm   Past Medical History:  Diagnosis Date   Abnormal EKG    Adult hypothyroidism    Aortic aneurysm (HCC) 02/28/2021   Atherosclerosis of arteries 02/28/2021   Atrial fibrillation, new diagnosis August 2024 on preop EKG 04/05/2023   Benign essential hypertension    CAD (coronary artery disease) 01/08/2019   Cancer (HCC) 01/08/2019   Of the Left submandibular gland   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HCC)    Chronic GERD    Controlled insomnia    Elevated fasting blood sugar    Erectile disorder due to medical condition in male    Gait disturbance, 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   Hyperlipidemia 01/08/2019   Hypertension 01/08/2019   Iatrogenic hyperthyroidism 04/05/2023   Suppressed TSH at 0.05 and elevated free T4 at 2.4 on recent lab work from 04-01-2023 on current levothyroxine therapy 150 mcg  Once a day        Medication care plan discussed with patient 04/13/2023   Multi-vessel coronary artery stenosis    Olecranon bursitis of left elbow    Preoperative cardiovascular examination 04/05/2023   PVD (peripheral vascular disease) (HCC)    Stroke (cerebrum) (HCC) 01/08/2019   Thyroid disease 01/08/2019   TIA (transient ischemic attack)    Weight loss 12/18/2020    Past Surgical History:  Procedure Laterality Date   CORONARY ARTERY BYPASS GRAFT  2011   ELBOW SURGERY  10/2017   Bursitis   SALIVARY GLAND SURGERY  2016   Removal due to cancer   TONSILLECTOMY  1957  Current Medications: Current Meds  Medication Sig   aspirin EC 81 MG tablet Take 81 mg by mouth daily.   atorvastatin (LIPITOR) 40 MG tablet Take 40 mg by mouth daily.   clopidogrel (PLAVIX) 75 MG tablet Take 1 tablet (75 mg total) by mouth daily.   cyanocobalamin (,VITAMIN B-12,) 1000  MCG/ML injection Inject 1,000 mcg into the muscle every 30 (thirty) days.   ezetimibe (ZETIA) 10 MG tablet Take 10 mg by mouth daily.   levothyroxine (SYNTHROID) 100 MCG tablet Take 150 mcg by mouth daily before breakfast.   lisinopril (ZESTRIL) 10 MG tablet Take 1 tablet (10 mg total) by mouth daily.   meclizine (ANTIVERT) 25 MG tablet Take 25 mg by mouth 3 (three) times daily as needed for dizziness.   metoprolol tartrate (LOPRESSOR) 50 MG tablet Take 1 tablet (50 mg total) by mouth 2 (two) times daily.   mirabegron ER (MYRBETRIQ) 50 MG TB24 tablet Take 50 mg by mouth daily.   nitroGLYCERIN (NITROSTAT) 0.4 MG SL tablet Place 0.4 mg under the tongue every 5 (five) minutes as needed for chest pain.   omeprazole (PRILOSEC) 20 MG capsule Take 20 mg by mouth daily.   traZODone (DESYREL) 100 MG tablet Take 100 mg by mouth at bedtime.     Allergies:   Patient has no known allergies.   Social History   Socioeconomic History   Marital status: Married    Spouse name: Not on file   Number of children: Not on file   Years of education: Not on file   Highest education level: Not on file  Occupational History   Occupation: Freight forwarder    Comment: Retired  Tobacco Use   Smoking status: Former    Current packs/day: 0.00    Average packs/day: 1 pack/day for 10.0 years (10.0 ttl pk-yrs)    Types: Cigarettes    Start date: 83    Quit date: 1970    Years since quitting: 54.8   Smokeless tobacco: Never   Tobacco comments:    quit 45 years  Vaping Use   Vaping status: Never Used  Substance and Sexual Activity   Alcohol use: Yes    Alcohol/week: 1.0 - 2.0 standard drink of alcohol    Types: 1 - 2 Standard drinks or equivalent per week    Comment: 1-2 cocktails daily   Drug use: Never   Sexual activity: Not on file  Other Topics Concern   Not on file  Social History Narrative   Not on file   Social Determinants of Health   Financial Resource Strain: Not on file  Food  Insecurity: Not on file  Transportation Needs: Not on file  Physical Activity: Not on file  Stress: Not on file  Social Connections: Not on file     Family History: The patient's family history includes CAD in his mother; Heart attack in his father and mother; Heart disease in his father; Hypertension in his mother. ROS:   Please see the history of present illness.    All 14 point review of systems negative except as described per history of present illness  EKGs/Labs/Other Studies Reviewed:         Recent Labs: No results found for requested labs within last 365 days.  Recent Lipid Panel    Component Value Date/Time   CHOL 142 03/05/2022 1056   TRIG 72 03/05/2022 1056   HDL 64 03/05/2022 1056   CHOLHDL 2.2 03/05/2022 1056   CHOLHDL 2.8 02/28/2021  2305   VLDL 17 02/28/2021 2305   LDLCALC 64 03/05/2022 1056    Physical Exam:    VS:  BP 130/82 (BP Location: Left Arm, Patient Position: Sitting)   Pulse 90   Ht 5\' 11"  (1.803 m)   Wt 137 lb (62.1 kg)   SpO2 94%   BMI 19.11 kg/m     Wt Readings from Last 3 Encounters:  05/17/23 137 lb (62.1 kg)  04/13/23 136 lb 6.4 oz (61.9 kg)  04/05/23 136 lb 9.6 oz (62 kg)     GENERAL:  Well nourished, well developed in no acute distress NECK: No JVD; No carotid bruits CARDIAC: Irregularly irregular, S1 and S2 present, 3/6 soft systolic murmur best heard left midclavicular line CHEST:  Clear to auscultation without rales, wheezing or rhonchi  Extremities: No pitting pedal edema. Pulses bilaterally symmetric with radial 2+ and dorsalis pedis 2+ NEUROLOGIC:  Alert and oriented x 3   ASSESSMENT AND PLAN:   Mr. 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  Here for follow-up on results today. Problem List Items Addressed This Visit     CAD (coronary artery disease)    Remains on dual antiplatelet therapy in the setting of prior CVA and TIA Continue with atorvastatin 40 mg once daily. In the setting of ongoing frequent ventricular ectopy burden, wall motion abnormality with low normal LV function and mildly reduced RV function be will review for any significant ischemia burden with Lexiscan stress test with nuclear imaging for any high risk findings that can affect his perioperative restratification while he awaits surgery for the vocal cords.      Preoperative cardiovascular examination - Primary    Despite limited insight and functional status he is able to do his activities of independent living. In the setting of CAD history, now with frequent ventricular ectopy burden and echocardiogram findings we will further stratify with Lexiscan stress test with nuclear imaging.        Frequent PVCs    14% burden on recent event monitor. Asymptomatic. Advised to reduce alcohol consumption to no more than 1 drink 2 or 3 times a week.  We increase his metoprolol dose to 50 mg twice daily after reviewing the echo and event monitor results earlier this  week. Continue optimizing thyroid status with adjusting of levothyroxine dose per PCP.     Lexiscan stress test already ordered, pending for next week. Return to clinic based on test results.  Medication Adjustments/Labs and Tests Ordered: Current medicines are reviewed at length with the patient today.  Concerns regarding medicines are outlined above.  No orders of the defined types were placed in this encounter.  Medication changes:  No orders of the defined types were placed in this encounter.   Signed, Cecille Amsterdam, MD, MPH, [facility] 05/17/2023 11:12 AM     [facility]

## 2023-05-17 NOTE — Assessment & Plan Note (Signed)
Despite limited insight and functional status he is able to do his activities of independent living. In the setting of CAD history, now with frequent ventricular ectopy burden and echocardiogram findings we will further stratify with Lexiscan stress test with nuclear imaging.

## 2023-05-17 NOTE — Telephone Encounter (Signed)
Patient given detailed instructions per Myocardial Perfusion Study Information Sheet for the test on 05/24/23 Patient notified to arrive 15 minutes early and that it is imperative to arrive on time for appointment to keep from having the test rescheduled.  If you need to cancel or reschedule your appointment, please call the office within 24 hours of your appointment. . Patient verbalized understanding. Keith Watson

## 2023-05-17 NOTE — Patient Instructions (Signed)
Medication Instructions:  Your physician recommends that you continue on your current medications as directed. Please refer to the Current Medication list given to you today.  *If you need a refill on your cardiac medications before your next appointment, please call your pharmacy*   Lab Work: None If you have labs (blood work) drawn today and your tests are completely normal, you will receive your results only by: MyChart Message (if you have MyChart) OR A paper copy in the mail If you have any lab test that is abnormal or we need to change your treatment, we will call you to review the results.   Testing/Procedures: None   Follow-Up: At [facility], you and your health needs are our priority.  As part of our continuing mission to provide you with exceptional heart care, we have created designated Provider Care Teams.  These Care Teams include your primary Cardiologist (physician) and Advanced Practice Providers (APPs -  Physician Assistants and Nurse Practitioners) who all work together to provide you with the care you need, when you need it.  We recommend signing up for the patient portal called "MyChart".  Sign up information is provided on this After Visit Summary.  MyChart is used to connect with patients for Virtual Visits (Telemedicine).  Patients are able to view lab/test results, encounter notes, upcoming appointments, etc.  Non-urgent messages can be sent to your provider as well.   To learn more about what you can do with MyChart, go to [email or web address].    Your next appointment:   Follow up to be determined based on testing  Provider:   Huntley Dec, MD    Other Instructions None

## 2023-05-24 ENCOUNTER — Ambulatory Visit: Payer: Medicare Other

## 2023-05-24 DIAGNOSIS — I25810 Atherosclerosis of coronary artery bypass graft(s) without angina pectoris: Secondary | ICD-10-CM | POA: Diagnosis not present

## 2023-05-24 MED ORDER — TECHNETIUM TC 99M TETROFOSMIN IV KIT
8.5000 | PACK | Freq: Once | INTRAVENOUS | Status: AC | PRN
  Administered 2023-05-24: 8.5 via INTRAVENOUS

## 2023-05-24 MED ORDER — REGADENOSON 0.4 MG/5ML IV SOLN
0.4000 mg | Freq: Once | INTRAVENOUS | Status: AC
  Administered 2023-05-24: 0.4 mg via INTRAVENOUS

## 2023-05-24 MED ORDER — TECHNETIUM TC 99M TETROFOSMIN IV KIT
23.6000 | PACK | Freq: Once | INTRAVENOUS | Status: AC | PRN
Start: 2023-05-24 — End: 2023-05-24
  Administered 2023-05-24: 23.6 via INTRAVENOUS

## 2023-05-25 LAB — MYOCARDIAL PERFUSION IMAGING
LV dias vol: 94 mL (ref 62–150)
LV sys vol: 41 mL
Nuc Stress EF: 56 %
Peak HR: 84 {beats}/min
Rest HR: 71 {beats}/min
Rest Nuclear Isotope Dose: 8.5 mCi
SDS: 2
SRS: 4
SSS: 6
ST Depression (mm): 0 mm
Stress Nuclear Isotope Dose: 23.6 mCi
TID: 1

## 2023-05-26 ENCOUNTER — Telehealth: Payer: Self-pay

## 2023-05-26 NOTE — Telephone Encounter (Signed)
Left message for pt to call back to schedule 3 month f/u with Dr. Vincent Gros.

## 2023-06-15 DIAGNOSIS — D519 Vitamin B12 deficiency anemia, unspecified: Secondary | ICD-10-CM | POA: Diagnosis not present

## 2023-06-17 DIAGNOSIS — E039 Hypothyroidism, unspecified: Secondary | ICD-10-CM | POA: Diagnosis not present

## 2023-07-05 DIAGNOSIS — J383 Other diseases of vocal cords: Secondary | ICD-10-CM | POA: Diagnosis not present

## 2023-07-18 DIAGNOSIS — E039 Hypothyroidism, unspecified: Secondary | ICD-10-CM | POA: Diagnosis not present

## 2023-07-20 DIAGNOSIS — D519 Vitamin B12 deficiency anemia, unspecified: Secondary | ICD-10-CM | POA: Diagnosis not present

## 2023-07-21 DIAGNOSIS — J383 Other diseases of vocal cords: Secondary | ICD-10-CM | POA: Diagnosis not present

## 2023-07-21 DIAGNOSIS — Z87891 Personal history of nicotine dependence: Secondary | ICD-10-CM | POA: Diagnosis not present

## 2023-07-21 DIAGNOSIS — G459 Transient cerebral ischemic attack, unspecified: Secondary | ICD-10-CM | POA: Diagnosis not present

## 2023-07-21 DIAGNOSIS — I719 Aortic aneurysm of unspecified site, without rupture: Secondary | ICD-10-CM | POA: Diagnosis not present

## 2023-07-21 DIAGNOSIS — I2510 Atherosclerotic heart disease of native coronary artery without angina pectoris: Secondary | ICD-10-CM | POA: Diagnosis not present

## 2023-07-21 DIAGNOSIS — Z9889 Other specified postprocedural states: Secondary | ICD-10-CM | POA: Diagnosis not present

## 2023-07-21 DIAGNOSIS — Z8589 Personal history of malignant neoplasm of other organs and systems: Secondary | ICD-10-CM | POA: Diagnosis not present

## 2023-08-01 DIAGNOSIS — C320 Malignant neoplasm of glottis: Secondary | ICD-10-CM | POA: Diagnosis not present

## 2023-08-01 DIAGNOSIS — Z87891 Personal history of nicotine dependence: Secondary | ICD-10-CM | POA: Diagnosis not present

## 2023-08-01 DIAGNOSIS — J387 Other diseases of larynx: Secondary | ICD-10-CM | POA: Diagnosis not present

## 2023-08-01 DIAGNOSIS — J383 Other diseases of vocal cords: Secondary | ICD-10-CM | POA: Diagnosis not present

## 2023-08-01 DIAGNOSIS — C329 Malignant neoplasm of larynx, unspecified: Secondary | ICD-10-CM | POA: Diagnosis not present

## 2023-08-08 DIAGNOSIS — J208 Acute bronchitis due to other specified organisms: Secondary | ICD-10-CM | POA: Diagnosis not present

## 2023-08-08 DIAGNOSIS — B9689 Other specified bacterial agents as the cause of diseases classified elsewhere: Secondary | ICD-10-CM | POA: Diagnosis not present

## 2023-08-12 ENCOUNTER — Telehealth: Payer: Self-pay | Admitting: Radiation Oncology

## 2023-08-12 NOTE — Telephone Encounter (Signed)
 Received MRR from [facility] records from [facility] No records found in epic, request forwarded to [facility] records.

## 2023-08-26 DIAGNOSIS — D519 Vitamin B12 deficiency anemia, unspecified: Secondary | ICD-10-CM | POA: Diagnosis not present

## 2023-08-29 DIAGNOSIS — D485 Neoplasm of uncertain behavior of skin: Secondary | ICD-10-CM | POA: Diagnosis not present

## 2023-09-06 DIAGNOSIS — C329 Malignant neoplasm of larynx, unspecified: Secondary | ICD-10-CM | POA: Diagnosis not present

## 2023-09-07 ENCOUNTER — Ambulatory Visit: Payer: Medicare PPO

## 2023-09-07 VITALS — BP 132/87 | HR 74 | Ht 71.0 in | Wt 131.6 lb

## 2023-09-07 DIAGNOSIS — I25810 Atherosclerosis of coronary artery bypass graft(s) without angina pectoris: Secondary | ICD-10-CM

## 2023-09-07 DIAGNOSIS — I4819 Other persistent atrial fibrillation: Secondary | ICD-10-CM

## 2023-09-07 DIAGNOSIS — I493 Ventricular premature depolarization: Secondary | ICD-10-CM

## 2023-09-07 NOTE — Progress Notes (Signed)
 Cardiology Consultation:    Date:  09/07/2023   ID:  Keith Rattler Abron Raddle., DOB Jul 15, 1934, MRN [id number]  PCP:  Keren Vicenta BRAVO, MD  Cardiologist:  Alean SAUNDERS Aerilynn Goin, MD   Referring MD: Keren Vicenta BRAVO, MD   No chief complaint on file.    ASSESSMENT AND PLAN:   Keith Watson 89 year old  history of persistent fibrillation, not on anticoagulation due to fall risk, CAD s/p CABG in 2011 no significant ischemia burden on Lexiscan  stress test from October 2024 done for preop assessment,, TIA/CVA in 2020 and 2023 and remains on dual antiplatelet therapy, distal thoracic and abdominal aortic aneurysm, mild aortic insufficiency, mild to moderate Keith, mild tricuspid regurgitation, chronic right bundle branch block, nonobstructive carotid artery disease, nonobstructive mesenteric artery stenosis on CT abdomen imaging, hyperlipidemia, low normal LVEF and mildly reduced LV function with baseline LV wall motion abnormalities on echocardiogram October 2024, ventricular ectopy burden 14% asymptomatic [14-day event monitor September 2024], hypothyroidism, moderate alcohol consumption, 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  On follow-up doing well from cardiac standpoint.  Problem List Items Addressed This Visit     CAD (coronary artery disease)   Remains on dual antiplatelet therapy in the setting of prior CVA and TIA. Continue with aspirin  81 mg and Plavix  75 mg once daily Continue with atorvastatin  40 mg once daily.  Continue with metoprolol  50 mg twice daily which was not titrated up at last visit for his frequent ventricular ectopy burden.       Atrial fibrillation, new diagnosis August 2024 on preop EKG - Primary   CHA2DS2-VASc score 6. High fall risk and hence not on anticoagulation per discussion with patient and family  members in the past. Continues to be frail and now being started on chemoradiation therapy.  Anticoagulation versus left atrial appendage occlusion may be considered in future depending on his overall prognosis from his current diagnosis of laryngeal cancer. Rates appear well-controlled.  Currently on metoprolol  tartrate 50 mg twice daily.       Frequent PVCs   Remains asymptomatic. Continue metoprolol . Advised to cut back on alcohol consumption.       Return to clinic in 1 year or as needed.   History of Present Illness:    Keith Brink. is a 89 y.o. male who is being seen today for follow-up visit. PCP is Keren Vicenta BRAVO, MD. Last visit with us  in the office was May 17, 2023.  Seen today in the clinic, accompanied by his caregiver.  His son Keith Watson was present on phone for the discussion.  Has history of persistent fibrillation, not on anticoagulation due to fall risk, CAD s/p CABG in 2011, TIA/CVA in 2020 and 2023 and remains on dual antiplatelet therapy, distal thoracic and abdominal aortic aneurysm, mild aortic insufficiency, mild to moderate Keith, mild tricuspid regurgitation, chronic right bundle branch block, nonobstructive carotid artery disease, nonobstructive mesenteric artery stenosis on CT abdomen imaging, hyperlipidemia, low normal LVEF and mildly reduced LV function with baseline LV wall motion abnormalities on echocardiogram October 2024, ventricular ectopy burden 14% asymptomatic [14-day event monitor September 2024], hypothyroidism, moderate alcohol consumption, 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  Lexiscan  stress test done May 24, 2023 for  preop risk assessment and pending his anticipated surgery for vocal cord tumor, showed low risk findings without significant ischemia  burden.  Since then has had the resection and diagnosed with squamous cell carcinoma of the larynx, had staging with PET scans done yesterday and awaiting further evaluation regarding chemoradiation therapy plans.  From cardiac standpoint he denies any symptoms of chest pain, shortness of breath, palpitations.  Denies any lightheadedness or syncopal episodes.  Balance tends to be poor but he has significant support at home and no recent falls.  Past Medical History:  Diagnosis Date   Abnormal EKG    Adult hypothyroidism    Aortic aneurysm (HCC) 02/28/2021   Atherosclerosis of arteries 02/28/2021   Atrial fibrillation, new diagnosis August 2024 on preop EKG 04/05/2023   Benign essential hypertension    CAD (coronary artery disease) 01/08/2019   Cancer (HCC) 01/08/2019   Of the Left submandibular gland   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HCC)    Chronic GERD    Controlled insomnia    Elevated fasting blood sugar    Erectile disorder due to medical condition in male    Frequent PVCs 05/17/2023   Gait disturbance, 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   Hyperlipidemia 01/08/2019   Hypertension 01/08/2019   Iatrogenic hyperthyroidism 04/05/2023   Suppressed TSH at 0.05 and elevated free T4 at 2.4 on recent lab work from 04-01-2023 on current levothyroxine  therapy 150 mcg  Once a day        Medication care plan discussed with patient 04/13/2023   Multi-vessel coronary artery stenosis    Olecranon bursitis of left elbow    Preoperative cardiovascular examination 04/05/2023   PVD (peripheral vascular disease) (HCC)    Stroke (cerebrum) (HCC) 01/08/2019   Thyroid  disease 01/08/2019   TIA (transient ischemic attack)    Weight loss 12/18/2020    Past Surgical History:  Procedure Laterality Date   CORONARY ARTERY BYPASS GRAFT  2011   ELBOW SURGERY  10/2017   Bursitis   SALIVARY GLAND SURGERY  2016   Removal due to cancer   TONSILLECTOMY  1957    Current Medications: Current Meds  Medication Sig    aspirin  EC 81 MG tablet Take 81 mg by mouth daily.   atorvastatin  (LIPITOR ) 40 MG tablet Take 40 mg by mouth daily.   clopidogrel  (PLAVIX ) 75 MG tablet Take 1 tablet (75 mg total) by mouth daily.   cyanocobalamin  (,VITAMIN B-12,) 1000 MCG/ML injection Inject 1,000 mcg into the muscle every 30 (thirty) days.   ezetimibe  (ZETIA ) 10 MG tablet Take 10 mg by mouth daily.   levothyroxine  (SYNTHROID ) 100 MCG tablet Take 150 mcg by mouth daily before breakfast.   lisinopril  (ZESTRIL ) 10 MG tablet Take 1 tablet (10 mg total) by mouth daily.   meclizine (ANTIVERT) 25 MG tablet Take 25 mg by mouth 3 (three) times daily as needed for dizziness.   metoprolol  tartrate (LOPRESSOR ) 50 MG tablet Take 1 tablet (50 mg total) by mouth 2 (two) times daily.   mirabegron  ER (MYRBETRIQ ) 50 MG TB24 tablet Take 50 mg by mouth daily.   nitroGLYCERIN  (NITROSTAT ) 0.4 MG SL tablet Place 0.4 mg under the tongue every 5 (five) minutes as needed for chest pain.   omeprazole (PRILOSEC) 20 MG capsule Take 20 mg by mouth as needed (heartburn).     Allergies:   Patient has no known allergies.   Social History   Socioeconomic History   Marital status: Married    Spouse name: Not on file   Number  of children: Not on file   Years of education: Not on file   Highest education level: Not on file  Occupational History   Occupation: High School Principal    Comment: Retired  Tobacco Use   Smoking status: Former    Current packs/day: 0.00    Average packs/day: 1 pack/day for 10.0 years (10.0 ttl pk-yrs)    Types: Cigarettes    Start date: 39    Quit date: 1970    Years since quitting: 55.1   Smokeless tobacco: Never   Tobacco comments:    quit 45 years  Vaping Use   Vaping status: Never Used  Substance and Sexual Activity   Alcohol use: Yes    Alcohol/week: 1.0 - 2.0 standard drink of alcohol    Types: 1 - 2 Standard drinks or equivalent per week    Comment: 1-2 cocktails daily   Drug use: Never   Sexual  activity: Not on file  Other Topics Concern   Not on file  Social History Narrative   Not on file   Social Drivers of Health   Financial Resource Strain: Not on file  Food Insecurity: Not on file  Transportation Needs: Not on file  Physical Activity: Not on file  Stress: Not on file  Social Connections: Not on file     Family History: The patient's family history includes CAD in his mother; Heart attack in his father and mother; Heart disease in his father; Hypertension in his mother. ROS:   Please see the history of present illness.    All 14 point review of systems negative except as described per history of present illness.  EKGs/Labs/Other Studies Reviewed:    The following studies were reviewed today:   EKG:       Recent Labs: No results found for requested labs within last 365 days.  Recent Lipid Panel    Component Value Date/Time   CHOL 142 03/05/2022 1056   TRIG 72 03/05/2022 1056   HDL 64 03/05/2022 1056   CHOLHDL 2.2 03/05/2022 1056   CHOLHDL 2.8 02/28/2021 2305   VLDL 17 02/28/2021 2305   LDLCALC 64 03/05/2022 1056    Physical Exam:    VS:  BP 132/87   Pulse 74   Ht 5' 11 (1.803 m)   Wt 131 lb 9.6 oz (59.7 kg)   SpO2 96%   BMI 18.35 kg/m     Wt Readings from Last 3 Encounters:  09/07/23 131 lb 9.6 oz (59.7 kg)  05/24/23 137 lb (62.1 kg)  05/17/23 137 lb (62.1 kg)     GENERAL:  Well nourished, well developed in no acute distress. Hoarse voice which is new in comparison to prior visits, consistent with his recent vocal cord surgery. NECK: No JVD; No carotid bruits CARDIAC: Irregularly irregular, S1 and S2 present, 3/6 soft systolic murmur best heard left parasternal region. CHEST:  Clear to auscultation without rales, wheezing or rhonchi  Extremities: No pitting pedal edema. Pulses bilaterally symmetric with radial 2+ and dorsalis pedis 2+ NEUROLOGIC:  Alert and oriented x 3  Medication Adjustments/Labs and Tests Ordered: Current medicines  are reviewed at length with the patient today.  Concerns regarding medicines are outlined above.  No orders of the defined types were placed in this encounter.  No orders of the defined types were placed in this encounter.   Signed, Alean jess Kobus, MD, MPH, [facility]. 09/07/2023 11:28 AM    [facility]

## 2023-09-07 NOTE — Assessment & Plan Note (Signed)
 CHA2DS2-VASc score 6. High fall risk and hence not on anticoagulation per discussion with patient and family members in the past. Continues to be frail and now being started on chemoradiation therapy.  Anticoagulation versus left atrial appendage occlusion may be considered in future depending on his overall prognosis from his current diagnosis of laryngeal cancer. Rates appear well-controlled.  Currently on metoprolol  tartrate 50 mg twice daily.

## 2023-09-07 NOTE — Patient Instructions (Signed)
Medication Instructions:  Your physician recommends that you continue on your current medications as directed. Please refer to the Current Medication list given to you today.  *If you need a refill on your cardiac medications before your next appointment, please call your pharmacy*   Lab Work: None ordered If you have labs (blood work) drawn today and your tests are completely normal, you will receive your results only by: MyChart Message (if you have MyChart) OR A paper copy in the mail If you have any lab test that is abnormal or we need to change your treatment, we will call you to review the results.   Testing/Procedures: None ordered   Follow-Up: At [facility], you and your health needs are our priority.  As part of our continuing mission to provide you with exceptional heart care, we have created designated Provider Care Teams.  These Care Teams include your primary Cardiologist (physician) and Advanced Practice Providers (APPs -  Physician Assistants and Nurse Practitioners) who all work together to provide you with the care you need, when you need it.  We recommend signing up for the patient portal called "MyChart".  Sign up information is provided on this After Visit Summary.  MyChart is used to connect with patients for Virtual Visits (Telemedicine).  Patients are able to view lab/test results, encounter notes, upcoming appointments, etc.  Non-urgent messages can be sent to your provider as well.   To learn more about what you can do with MyChart, go to [email or web address].    Your next appointment:   12 month(s)  The format for your next appointment:   In Person  Provider:   Huntley Dec, MD    Other Instructions none  Important Information About Sugar

## 2023-09-07 NOTE — Assessment & Plan Note (Signed)
 Remains on dual antiplatelet therapy in the setting of prior CVA and TIA. Continue with aspirin  81 mg and Plavix  75 mg once daily Continue with atorvastatin  40 mg once daily.  Continue with metoprolol  50 mg twice daily which was not titrated up at last visit for his frequent ventricular ectopy burden.

## 2023-09-07 NOTE — Assessment & Plan Note (Signed)
 Remains asymptomatic. Continue metoprolol . Advised to cut back on alcohol consumption.

## 2023-09-14 DIAGNOSIS — C329 Malignant neoplasm of larynx, unspecified: Secondary | ICD-10-CM | POA: Diagnosis not present

## 2023-11-01 DEATH — deceased
